# Patient Record
Sex: Female | Born: 1965 | Race: Black or African American | Hispanic: No | Marital: Married | Smoking: Never smoker
Health system: Southern US, Community
[De-identification: ages and names within clinical notes are randomized; demographics above are authoritative.]

## PROBLEM LIST (undated history)

## (undated) DIAGNOSIS — I10 Essential (primary) hypertension: Secondary | ICD-10-CM

## (undated) DIAGNOSIS — F419 Anxiety disorder, unspecified: Secondary | ICD-10-CM

## (undated) DIAGNOSIS — G4700 Insomnia, unspecified: Secondary | ICD-10-CM

## (undated) DIAGNOSIS — E119 Type 2 diabetes mellitus without complications: Secondary | ICD-10-CM

## (undated) HISTORY — DX: Essential (primary) hypertension: I10

## (undated) HISTORY — PX: CHOLECYSTECTOMY: SHX55

## (undated) HISTORY — DX: Insomnia, unspecified: G47.00

## (undated) HISTORY — PX: ABLATION: SHX5711

## (undated) HISTORY — DX: Anxiety disorder, unspecified: F41.9

---

## 2005-08-08 ENCOUNTER — Emergency Department (HOSPITAL_COMMUNITY): Admission: EM | Admit: 2005-08-08 | Discharge: 2005-08-08 | Payer: Self-pay | Admitting: Emergency Medicine

## 2007-03-08 ENCOUNTER — Emergency Department (HOSPITAL_COMMUNITY): Admission: EM | Admit: 2007-03-08 | Discharge: 2007-03-08 | Payer: Self-pay | Admitting: Emergency Medicine

## 2008-10-31 ENCOUNTER — Ambulatory Visit (HOSPITAL_COMMUNITY): Admission: RE | Admit: 2008-10-31 | Discharge: 2008-10-31 | Payer: Self-pay | Admitting: Family Medicine

## 2009-02-25 ENCOUNTER — Ambulatory Visit (HOSPITAL_COMMUNITY): Admission: RE | Admit: 2009-02-25 | Discharge: 2009-02-25 | Payer: Self-pay | Admitting: Family Medicine

## 2009-08-29 ENCOUNTER — Ambulatory Visit (HOSPITAL_COMMUNITY): Payer: Self-pay | Admitting: Psychiatry

## 2009-09-11 ENCOUNTER — Ambulatory Visit (HOSPITAL_COMMUNITY): Payer: Self-pay | Admitting: Psychiatry

## 2009-09-18 ENCOUNTER — Ambulatory Visit (HOSPITAL_COMMUNITY): Payer: Self-pay | Admitting: Psychiatry

## 2009-09-25 ENCOUNTER — Ambulatory Visit (HOSPITAL_COMMUNITY): Payer: Self-pay | Admitting: Psychiatry

## 2009-10-02 ENCOUNTER — Ambulatory Visit (HOSPITAL_COMMUNITY): Payer: Self-pay | Admitting: Psychiatry

## 2009-10-10 ENCOUNTER — Ambulatory Visit (HOSPITAL_COMMUNITY): Payer: Self-pay | Admitting: Psychiatry

## 2009-10-16 ENCOUNTER — Ambulatory Visit (HOSPITAL_COMMUNITY): Payer: Self-pay | Admitting: Psychiatry

## 2009-11-06 ENCOUNTER — Ambulatory Visit (HOSPITAL_COMMUNITY): Payer: Self-pay | Admitting: Psychiatry

## 2009-11-14 ENCOUNTER — Ambulatory Visit (HOSPITAL_COMMUNITY): Payer: Self-pay | Admitting: Psychiatry

## 2009-11-15 ENCOUNTER — Ambulatory Visit (HOSPITAL_COMMUNITY): Admission: RE | Admit: 2009-11-15 | Discharge: 2009-11-15 | Payer: Self-pay | Admitting: Internal Medicine

## 2009-11-21 ENCOUNTER — Ambulatory Visit (HOSPITAL_COMMUNITY): Admission: RE | Admit: 2009-11-21 | Discharge: 2009-11-21 | Payer: Self-pay | Admitting: Family Medicine

## 2010-08-10 ENCOUNTER — Encounter: Payer: Self-pay | Admitting: Family Medicine

## 2010-08-10 ENCOUNTER — Encounter: Payer: Self-pay | Admitting: Preventative Medicine

## 2010-09-30 ENCOUNTER — Other Ambulatory Visit (HOSPITAL_COMMUNITY): Payer: Self-pay | Admitting: Internal Medicine

## 2010-09-30 ENCOUNTER — Ambulatory Visit (HOSPITAL_COMMUNITY)
Admission: RE | Admit: 2010-09-30 | Discharge: 2010-09-30 | Disposition: A | Discharge status: Home / Self Care | Payer: 59 | Source: Ambulatory Visit | Attending: Internal Medicine | Admitting: Internal Medicine

## 2010-09-30 DIAGNOSIS — R1031 Right lower quadrant pain: Secondary | ICD-10-CM | POA: Insufficient documentation

## 2010-10-01 ENCOUNTER — Other Ambulatory Visit (HOSPITAL_COMMUNITY): Payer: Self-pay | Admitting: Internal Medicine

## 2010-10-01 DIAGNOSIS — R1031 Right lower quadrant pain: Secondary | ICD-10-CM

## 2010-10-03 ENCOUNTER — Other Ambulatory Visit (HOSPITAL_COMMUNITY): Payer: 59

## 2010-10-10 ENCOUNTER — Ambulatory Visit (HOSPITAL_COMMUNITY)
Admission: RE | Admit: 2010-10-10 | Discharge: 2010-10-10 | Disposition: A | Discharge status: Home / Self Care | Payer: 59 | Source: Ambulatory Visit | Attending: Internal Medicine | Admitting: Internal Medicine

## 2010-10-10 ENCOUNTER — Other Ambulatory Visit (HOSPITAL_COMMUNITY): Payer: 59

## 2010-10-10 ENCOUNTER — Other Ambulatory Visit (HOSPITAL_COMMUNITY): Payer: Self-pay | Admitting: Internal Medicine

## 2010-10-10 DIAGNOSIS — R1031 Right lower quadrant pain: Secondary | ICD-10-CM | POA: Insufficient documentation

## 2010-10-10 DIAGNOSIS — R932 Abnormal findings on diagnostic imaging of liver and biliary tract: Secondary | ICD-10-CM | POA: Insufficient documentation

## 2011-01-16 ENCOUNTER — Other Ambulatory Visit: Payer: Self-pay

## 2011-01-16 ENCOUNTER — Encounter (HOSPITAL_COMMUNITY)
Admission: RE | Admit: 2011-01-16 | Discharge: 2011-01-16 | Disposition: A | Discharge status: Home / Self Care | Payer: 59 | Source: Ambulatory Visit | Attending: Obstetrics & Gynecology | Admitting: Obstetrics & Gynecology

## 2011-01-16 LAB — COMPREHENSIVE METABOLIC PANEL
Alkaline Phosphatase: 67 U/L (ref 39–117)
BUN: 15 mg/dL (ref 6–23)
GFR calc Af Amer: >60 mL/min (ref >60)
Potassium: 3.8 mEq/L (ref 3.5–5.1)
Sodium: 137 mEq/L (ref 135–145)
Total Bilirubin: 0.2 mg/dL — ABNORMAL LOW (ref 0.3–1.2)
Total Protein: 7.6 g/dL (ref 6.0–8.3)

## 2011-01-16 LAB — CBC
Hemoglobin: 10.6 g/dL — ABNORMAL LOW (ref 12.0–15.0)
MCHC: 32.3 g/dL (ref 30.0–36.0)
MCV: 82.2 fL (ref 78.0–100.0)

## 2011-01-16 LAB — HCG, QUANTITATIVE, PREGNANCY: hCG, Beta Chain, Quant, S: 1 m[IU]/mL (ref <5)

## 2011-01-16 LAB — SURGICAL PCR SCREEN: MRSA, PCR: NEGATIVE

## 2011-01-23 ENCOUNTER — Ambulatory Visit (HOSPITAL_COMMUNITY)
Admission: RE | Admit: 2011-01-23 | Discharge: 2011-01-23 | Disposition: A | Discharge status: Home / Self Care | Payer: 59 | Source: Ambulatory Visit | Attending: Obstetrics & Gynecology | Admitting: Obstetrics & Gynecology

## 2011-01-23 ENCOUNTER — Other Ambulatory Visit: Payer: Self-pay | Admitting: Obstetrics & Gynecology

## 2011-01-23 ENCOUNTER — Ambulatory Visit (HOSPITAL_COMMUNITY): Payer: 59 | Admitting: Obstetrics & Gynecology

## 2011-01-23 DIAGNOSIS — Z01812 Encounter for preprocedural laboratory examination: Secondary | ICD-10-CM | POA: Insufficient documentation

## 2011-01-23 DIAGNOSIS — Z79899 Other long term (current) drug therapy: Secondary | ICD-10-CM | POA: Insufficient documentation

## 2011-01-23 DIAGNOSIS — I10 Essential (primary) hypertension: Secondary | ICD-10-CM | POA: Insufficient documentation

## 2011-01-23 DIAGNOSIS — E119 Type 2 diabetes mellitus without complications: Secondary | ICD-10-CM | POA: Insufficient documentation

## 2011-01-23 DIAGNOSIS — N920 Excessive and frequent menstruation with regular cycle: Secondary | ICD-10-CM | POA: Insufficient documentation

## 2011-01-23 DIAGNOSIS — N946 Dysmenorrhea, unspecified: Secondary | ICD-10-CM | POA: Insufficient documentation

## 2011-01-23 LAB — URINALYSIS, ROUTINE W REFLEX MICROSCOPIC
Protein, ur: NEGATIVE mg/dL
Specific Gravity, Urine: 1.03 (ref 1.005–1.030)
pH: 6 (ref 5.0–8.0)

## 2011-01-23 NOTE — Procedures (Addendum)
Preoperative diagnosis: Menometrorrhagia                                        Dysmenorrhea   Postoperative diagnoses: Same as above plus micro-endometrial polyps   Procedure: Hysteroscopy, uterine curettage, endometrial ablation  Surgeon: Despina Hidden MD  Anesthesia: Laryngeal mask airway  Findings: The endometrium was normal with the exception of very small endometrial polyps which I would characterize as micro. There were no fibroid or other abnormalities.  Description of operation: The patient was taken to the operating room and placed in the supine position. She underwent general anesthesia using the laryngeal mask airway. She was placed in the dorsal lithotomy position and prepped and draped in the usual sterile fashion. A Graves speculum was placed and the anterior cervical lip was grasped with a single-tooth tenaculum. The cervix was dilated serially to allow passage of the hysteroscope. Diagnostic hysteroscopy was performed and was found to be normal except for several very small micro-endometrial polyps. A vigorous uterine curettage was then performed and all tissue sent to pathology for evaluation. The ThermaChoice 3 endometrial ablation balloon was then used were 13 cc of D5W was required to maintain a pressure of 190-200 mm of mercury throughout the procedure.  Total therapy time was 9 min 31 sec.  All of the equipment worked well throughout the procedure. All of the fluid was returned at the end of the procedure. The patient was awakened from anesthesia and taken to the recovery room in good stable condition all counts were correct. She received 1 g of Ancef and 30 mg of Toradol preoperatively. She will be discharged from the recovery room and followed up in the office next week.

## 2011-01-24 ENCOUNTER — Other Ambulatory Visit: Payer: Self-pay | Admitting: Obstetrics & Gynecology

## 2011-01-25 ENCOUNTER — Other Ambulatory Visit: Payer: Self-pay | Admitting: Obstetrics & Gynecology

## 2011-03-09 ENCOUNTER — Other Ambulatory Visit: Payer: Self-pay | Admitting: Obstetrics & Gynecology

## 2011-03-09 DIAGNOSIS — Z139 Encounter for screening, unspecified: Secondary | ICD-10-CM

## 2011-03-13 ENCOUNTER — Ambulatory Visit (HOSPITAL_COMMUNITY): Payer: 59

## 2011-03-13 ENCOUNTER — Ambulatory Visit (HOSPITAL_COMMUNITY)
Admission: RE | Admit: 2011-03-13 | Discharge: 2011-03-13 | Disposition: A | Discharge status: Home / Self Care | Payer: 59 | Source: Ambulatory Visit | Attending: Obstetrics & Gynecology | Admitting: Obstetrics & Gynecology

## 2011-03-13 DIAGNOSIS — Z1231 Encounter for screening mammogram for malignant neoplasm of breast: Secondary | ICD-10-CM | POA: Insufficient documentation

## 2011-03-13 DIAGNOSIS — Z139 Encounter for screening, unspecified: Secondary | ICD-10-CM

## 2011-03-19 ENCOUNTER — Other Ambulatory Visit (HOSPITAL_COMMUNITY): Payer: Self-pay | Admitting: Family Medicine

## 2011-03-19 ENCOUNTER — Other Ambulatory Visit: Payer: Self-pay | Admitting: Obstetrics & Gynecology

## 2011-03-19 DIAGNOSIS — IMO0002 Reserved for concepts with insufficient information to code with codable children: Secondary | ICD-10-CM

## 2011-03-19 DIAGNOSIS — R928 Other abnormal and inconclusive findings on diagnostic imaging of breast: Secondary | ICD-10-CM

## 2011-03-19 DIAGNOSIS — R109 Unspecified abdominal pain: Secondary | ICD-10-CM

## 2011-03-19 DIAGNOSIS — R10819 Abdominal tenderness, unspecified site: Secondary | ICD-10-CM

## 2011-03-24 ENCOUNTER — Other Ambulatory Visit (HOSPITAL_COMMUNITY): Payer: 59

## 2011-03-25 ENCOUNTER — Ambulatory Visit (HOSPITAL_COMMUNITY)
Admission: RE | Admit: 2011-03-25 | Discharge: 2011-03-25 | Disposition: A | Discharge status: Home / Self Care | Payer: 59 | Source: Ambulatory Visit | Attending: Family Medicine | Admitting: Family Medicine

## 2011-03-25 DIAGNOSIS — R1031 Right lower quadrant pain: Secondary | ICD-10-CM | POA: Insufficient documentation

## 2011-03-25 DIAGNOSIS — R10819 Abdominal tenderness, unspecified site: Secondary | ICD-10-CM

## 2011-03-25 DIAGNOSIS — K7689 Other specified diseases of liver: Secondary | ICD-10-CM | POA: Insufficient documentation

## 2011-03-25 DIAGNOSIS — IMO0002 Reserved for concepts with insufficient information to code with codable children: Secondary | ICD-10-CM

## 2011-03-25 DIAGNOSIS — R109 Unspecified abdominal pain: Secondary | ICD-10-CM

## 2011-04-08 ENCOUNTER — Ambulatory Visit (HOSPITAL_COMMUNITY)
Admission: RE | Admit: 2011-04-08 | Discharge: 2011-04-08 | Disposition: A | Discharge status: Home / Self Care | Payer: 59 | Source: Ambulatory Visit | Attending: Obstetrics & Gynecology | Admitting: Obstetrics & Gynecology

## 2011-04-08 ENCOUNTER — Other Ambulatory Visit: Payer: Self-pay | Admitting: Obstetrics & Gynecology

## 2011-04-08 ENCOUNTER — Other Ambulatory Visit (HOSPITAL_COMMUNITY): Payer: Self-pay | Admitting: Obstetrics & Gynecology

## 2011-04-08 DIAGNOSIS — R928 Other abnormal and inconclusive findings on diagnostic imaging of breast: Secondary | ICD-10-CM

## 2011-10-21 ENCOUNTER — Other Ambulatory Visit (HOSPITAL_COMMUNITY): Payer: Self-pay | Admitting: Family Medicine

## 2011-10-21 DIAGNOSIS — Z09 Encounter for follow-up examination after completed treatment for conditions other than malignant neoplasm: Secondary | ICD-10-CM

## 2011-10-22 ENCOUNTER — Other Ambulatory Visit (HOSPITAL_COMMUNITY): Payer: Self-pay | Admitting: Family Medicine

## 2011-10-22 DIAGNOSIS — Z09 Encounter for follow-up examination after completed treatment for conditions other than malignant neoplasm: Secondary | ICD-10-CM

## 2011-10-28 ENCOUNTER — Ambulatory Visit (HOSPITAL_COMMUNITY): Payer: 59

## 2011-10-28 ENCOUNTER — Ambulatory Visit (HOSPITAL_COMMUNITY)
Admission: RE | Admit: 2011-10-28 | Discharge: 2011-10-28 | Disposition: A | Discharge status: Home / Self Care | Payer: 59 | Source: Ambulatory Visit | Attending: Family Medicine | Admitting: Family Medicine

## 2011-10-28 DIAGNOSIS — R928 Other abnormal and inconclusive findings on diagnostic imaging of breast: Secondary | ICD-10-CM | POA: Insufficient documentation

## 2011-10-28 DIAGNOSIS — Z09 Encounter for follow-up examination after completed treatment for conditions other than malignant neoplasm: Secondary | ICD-10-CM

## 2012-02-02 ENCOUNTER — Other Ambulatory Visit (HOSPITAL_COMMUNITY)
Admission: RE | Admit: 2012-02-02 | Discharge: 2012-02-02 | Disposition: A | Discharge status: Home / Self Care | Payer: 59 | Source: Ambulatory Visit | Attending: Obstetrics & Gynecology | Admitting: Obstetrics & Gynecology

## 2012-02-02 ENCOUNTER — Other Ambulatory Visit: Payer: Self-pay | Admitting: Obstetrics & Gynecology

## 2012-02-02 DIAGNOSIS — Z01419 Encounter for gynecological examination (general) (routine) without abnormal findings: Secondary | ICD-10-CM | POA: Insufficient documentation

## 2012-05-20 IMAGING — CT CT ABD-PELV W/O CM
2 of 4 series · 17 of 46 positions shown, 19 images · non-contrast
Comparison: CT abdomen pelvis 11/15/2009.

CLINICAL DATA: Right lower quadrant pain for 2 years.

CT ABDOMEN AND PELVIS WITHOUT CONTRAST
TECHNIQUE: Multidetector CT imaging of the abdomen and pelvis was
performed following the standard protocol without intravenous
contrast.

[Series 2: abdomen/pelvis w/o contrast · axial · non-contrast · 0.79mm/px · z∈[-467,-52]mm · 14 of 97 slices shown, 16 images]
[im 7/97  soft-tissue]
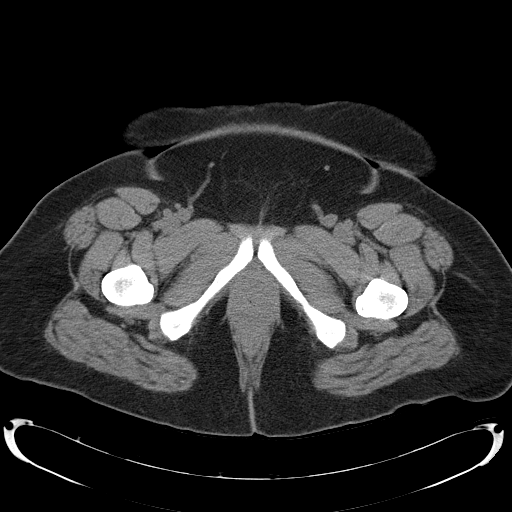
[im 7/97  bone]
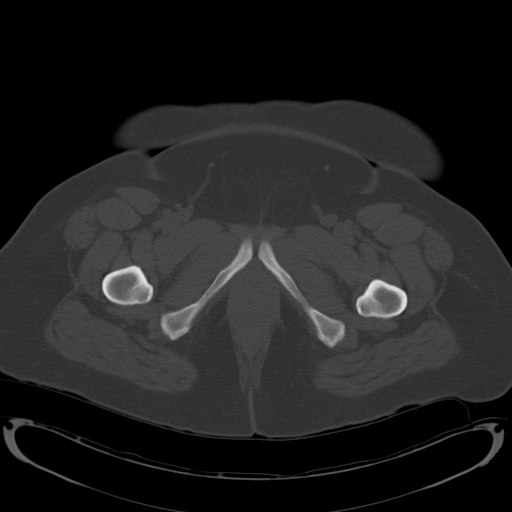
[im 13/97  soft-tissue]
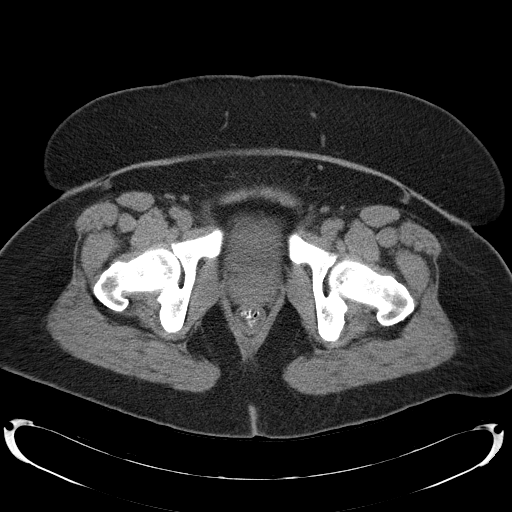
[im 20/97  soft-tissue]
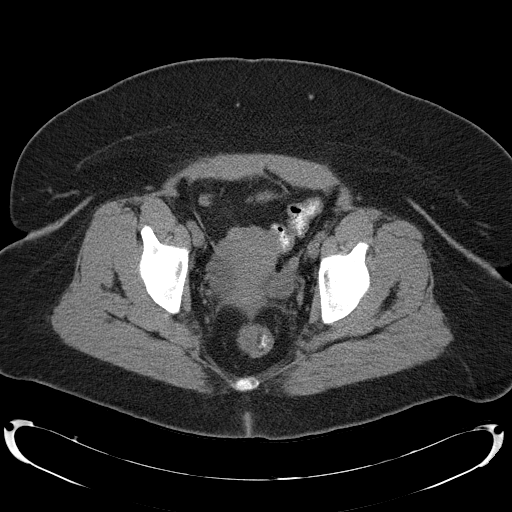
[im 26/97  soft-tissue]
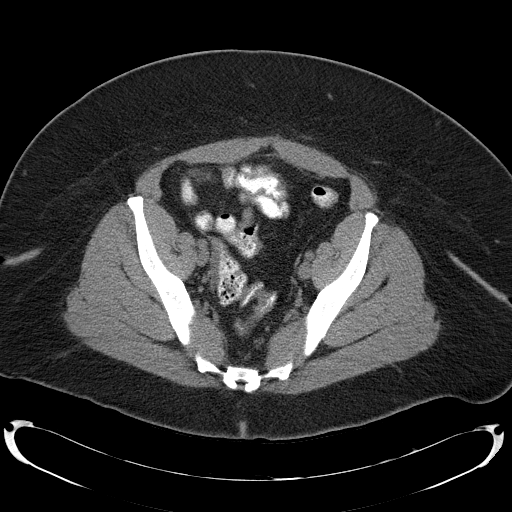
[im 33/97  soft-tissue]
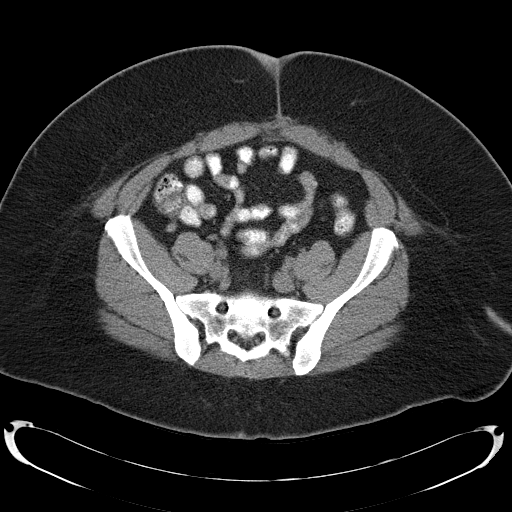
[im 39/97  soft-tissue]
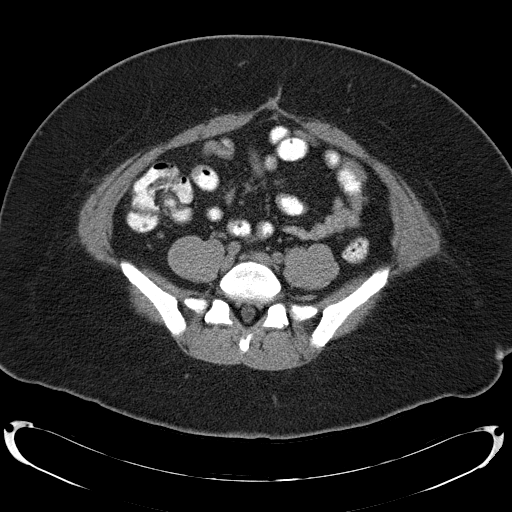
[im 45/97  soft-tissue]
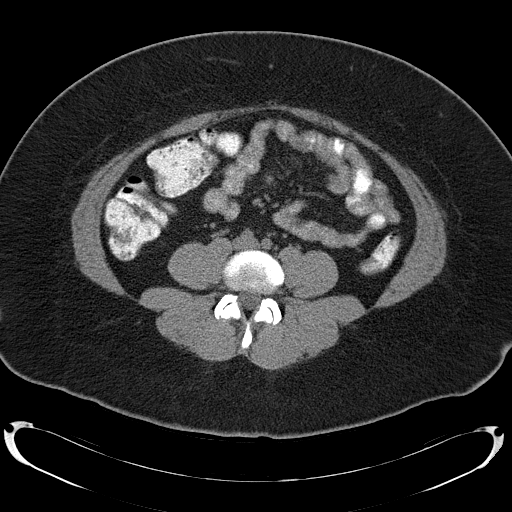
[im 52/97  soft-tissue]
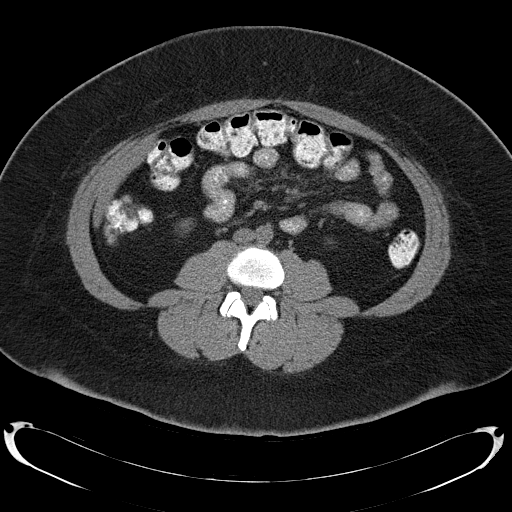
[im 58/97  soft-tissue]
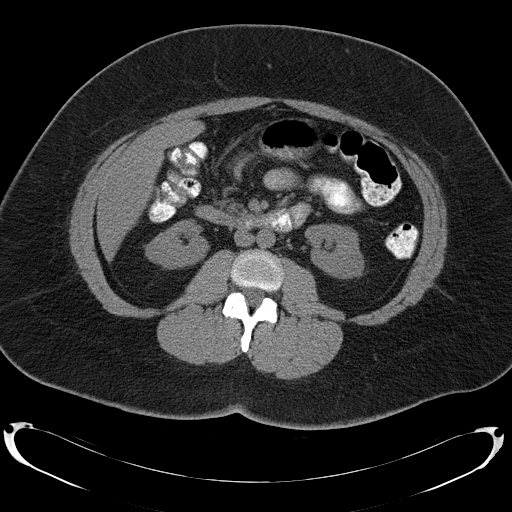
[im 58/97  bone]
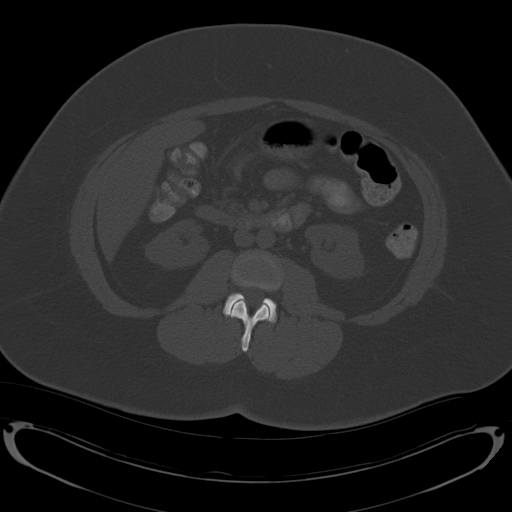
[im 65/97  soft-tissue]
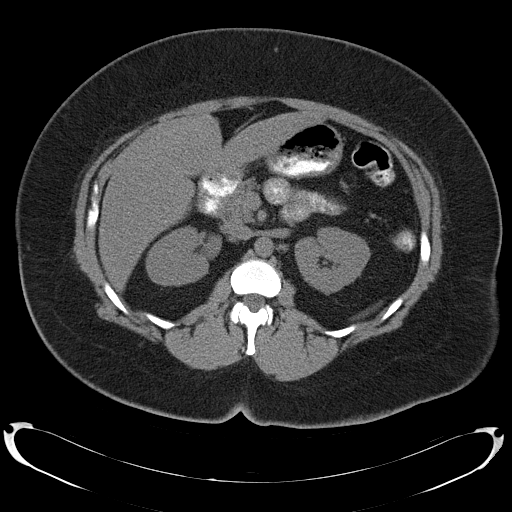
[im 71/97  soft-tissue]
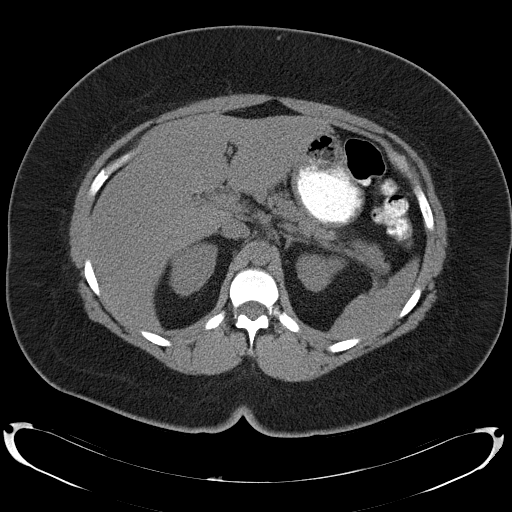
[im 77/97  soft-tissue]
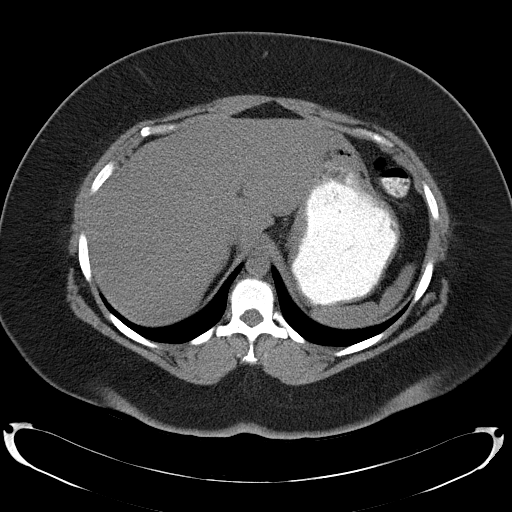
[im 84/97  soft-tissue]
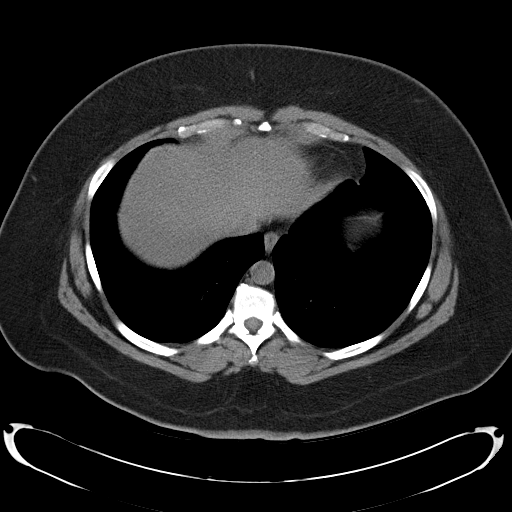
[im 90/97  soft-tissue]
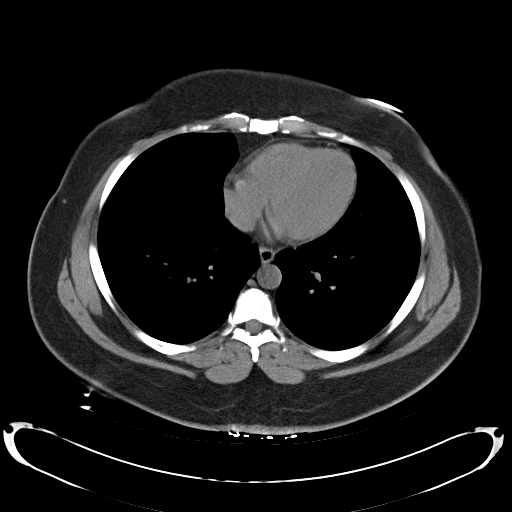

[Series 4: mpr cor (id) · coronal · 0.76mm/px · 3 of 84 slices shown]
[im 28/84  soft-tissue]
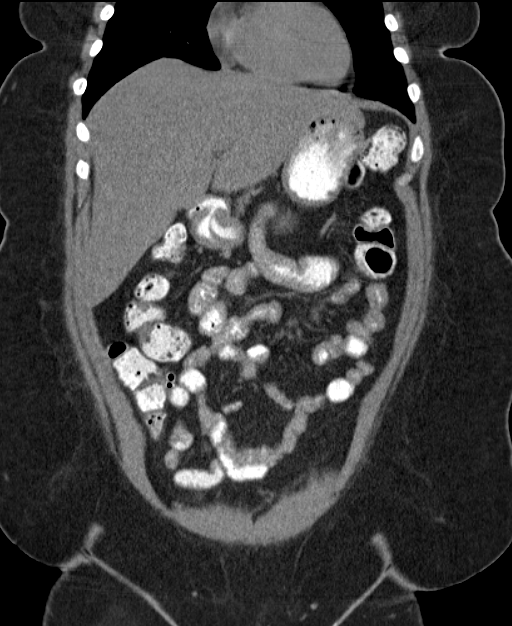
[im 37/84  soft-tissue]
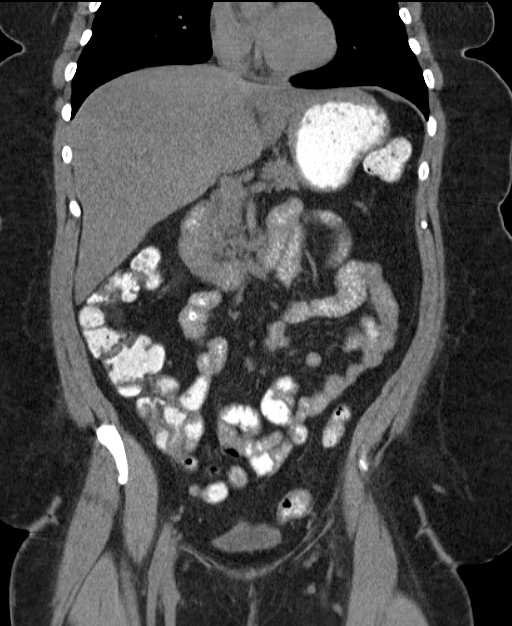
[im 47/84  soft-tissue]
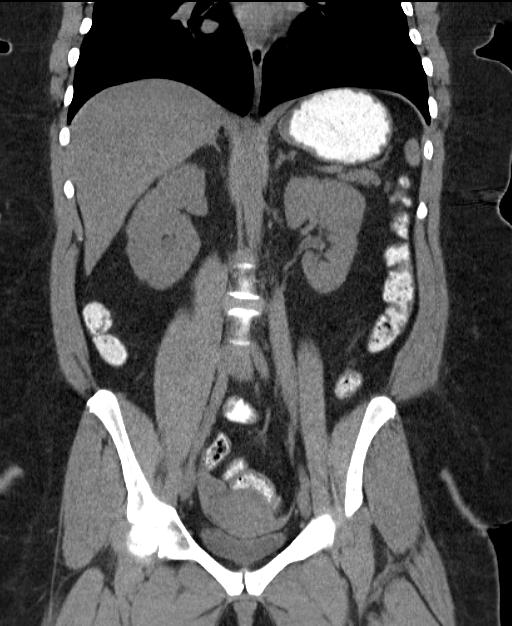

[17 of 46 positions shown; findings below may reference images not displayed]

FINDINGS: The lung bases are clear.  There is no pleural or
pericardial effusion.

The patient is status post cholecystectomy.  The liver is diffusely
low attenuating consistent with fatty change.  No focal liver
lesion is identified.  No renal or ureteral stones are seen.  The
patient has a duplicated right renal collecting system.  Urinary
bladder, uterus and adnexa appear normal.  The spleen, adrenal
glands and pancreas are unremarkable.  No lymphadenopathy or fluid.
The stomach, small and large bowel and appendix are unremarkable.
There is no focal bony abnormality.
IMPRESSION: 1.  No acute finding.  Negative for urinary tract stone.
2.  Fatty infiltration of the liver.
3.  Duplicated right renal collecting system is incidentally noted.
4.  Status post cholecystectomy.

## 2013-04-25 ENCOUNTER — Ambulatory Visit: Payer: Self-pay | Admitting: Obstetrics & Gynecology

## 2013-04-27 ENCOUNTER — Ambulatory Visit: Payer: Self-pay | Admitting: Obstetrics & Gynecology

## 2014-03-13 ENCOUNTER — Emergency Department (HOSPITAL_COMMUNITY)
Admission: EM | Admit: 2014-03-13 | Discharge: 2014-03-14 | Disposition: A | Discharge status: Home / Self Care | Payer: 59 | Attending: Emergency Medicine | Admitting: Emergency Medicine

## 2014-03-13 ENCOUNTER — Emergency Department (HOSPITAL_COMMUNITY): Payer: 59

## 2014-03-13 ENCOUNTER — Encounter (HOSPITAL_COMMUNITY): Payer: Self-pay | Admitting: Emergency Medicine

## 2014-03-13 DIAGNOSIS — Z79899 Other long term (current) drug therapy: Secondary | ICD-10-CM | POA: Diagnosis not present

## 2014-03-13 DIAGNOSIS — R079 Chest pain, unspecified: Secondary | ICD-10-CM | POA: Insufficient documentation

## 2014-03-13 DIAGNOSIS — E119 Type 2 diabetes mellitus without complications: Secondary | ICD-10-CM | POA: Diagnosis not present

## 2014-03-13 DIAGNOSIS — R0789 Other chest pain: Secondary | ICD-10-CM | POA: Diagnosis not present

## 2014-03-13 DIAGNOSIS — Z791 Long term (current) use of non-steroidal anti-inflammatories (NSAID): Secondary | ICD-10-CM | POA: Diagnosis not present

## 2014-03-13 HISTORY — DX: Type 2 diabetes mellitus without complications: E11.9

## 2014-03-13 LAB — CBC WITH DIFFERENTIAL/PLATELET
BASOS ABS: 0 10*3/uL (ref 0.0–0.1)
Basophils Relative: 0 % (ref 0–1)
Eosinophils Absolute: 0.1 10*3/uL (ref 0.0–0.7)
Eosinophils Relative: 1 % (ref 0–5)
HEMATOCRIT: 35.8 % — AB (ref 36.0–46.0)
Hemoglobin: 11.6 g/dL — ABNORMAL LOW (ref 12.0–15.0)
LYMPHS ABS: 2.3 10*3/uL (ref 0.7–4.0)
LYMPHS PCT: 25 % (ref 12–46)
MCH: 26.9 pg (ref 26.0–34.0)
MCHC: 32.4 g/dL (ref 30.0–36.0)
MCV: 82.9 fL (ref 78.0–100.0)
MONO ABS: 0.6 10*3/uL (ref 0.1–1.0)
Monocytes Relative: 6 % (ref 3–12)
NEUTROS ABS: 6.1 10*3/uL (ref 1.7–7.7)
Neutrophils Relative %: 68 % (ref 43–77)
PLATELETS: 314 10*3/uL (ref 150–400)
RBC: 4.32 MIL/uL (ref 3.87–5.11)
RDW: 13.1 % (ref 11.5–15.5)
WBC: 9 10*3/uL (ref 4.0–10.5)

## 2014-03-13 LAB — BASIC METABOLIC PANEL
ANION GAP: 14 (ref 5–15)
BUN: 13 mg/dL (ref 6–23)
CHLORIDE: 94 meq/L — AB (ref 96–112)
CO2: 27 meq/L (ref 19–32)
Calcium: 9.3 mg/dL (ref 8.4–10.5)
Creatinine, Ser: 0.97 mg/dL (ref 0.50–1.10)
GFR calc Af Amer: 79 mL/min — ABNORMAL LOW (ref >90)
GFR calc non Af Amer: 68 mL/min — ABNORMAL LOW (ref >90)
Glucose, Bld: 267 mg/dL — ABNORMAL HIGH (ref 70–99)
Potassium: 3.9 mEq/L (ref 3.7–5.3)
SODIUM: 135 meq/L — AB (ref 137–147)

## 2014-03-13 LAB — TROPONIN I

## 2014-03-13 MED ORDER — NAPROXEN 500 MG PO TABS: 500.0000 mg | ORAL_TABLET | Freq: Two times a day (BID) | ORAL | Status: DC

## 2014-03-13 MED ORDER — NAPROXEN 250 MG PO TABS
500.0000 mg | ORAL_TABLET | Freq: Once | ORAL | Status: AC
  Administered 2014-03-13: 500 mg via ORAL
  Filled 2014-03-13: qty 2

## 2014-03-13 NOTE — ED Notes (Signed)
Pt c/o right sided CP for 2 weeks, states pain has gotten worse, denies N/V or SOB

## 2014-03-13 NOTE — ED Provider Notes (Signed)
CSN: [id number]     Arrival date & time 03/13/14  2119 History  This chart was scribed for Theresa Roller, MD by Milly Jakob, ED Scribe. The patient was seen in room APA08/APA08. Patient's care was started at 11:10 PM.   Chief Complaint  Patient presents with  . Chest Pain   The history is provided by the patient. No language interpreter was used.   HPI Comments: Theresa Sullivan is a 48 y.o. female with a history of DM who presents to the Emergency Department complaining of intermittent, right sided, upper-substernal, chest pain which began two weeks ago. She states that the pain feels like a muscular strain. She reports that the pain is exacerbated by movement and position. She reports nausea, diarrhea, and difficulty eating. She reports a little bit of productive coughing. She also reports hearing changes in her right ear. She denies SOB. She states that the pain is not worsened by exertion or deep breathing. She reports that she was seen by her PCP for this last Friday with no diagnosis. She reports that she is borderline diabetic and she takes Metformin for this. She does not smoke. She denies a family history of heart disease. She denies chest injury or trauma, recent surgery or traveling. She states that she sits at work but gets up regularly. No risk factors for PE.   Past Medical History  Diagnosis Date  . Diabetes mellitus without complication    Past Surgical History  Procedure Laterality Date  . Cholecystectomy    . Ablation     History reviewed. No pertinent family history. History  Substance Use Topics  . Smoking status: Never Smoker   . Smokeless tobacco: Not on file  . Alcohol Use: No   OB History   Grav Para Term Preterm Abortions TAB SAB Ect Mult Living                 Review of Systems  Cardiovascular: Positive for chest pain.  All other systems reviewed and are negative.     Allergies  Review of patient's allergies indicates no known allergies.  Home  Medications   Prior to Admission medications   Medication Sig Start Date End Date Taking? Authorizing Provider  ferrous sulfate 325 (65 FE) MG tablet Take 325 mg by mouth daily.   Yes Historical Provider, MD  Boris Lown Oil 300 MG CAPS Take 900 mg by mouth daily.   Yes Historical Provider, MD  metFORMIN (GLUCOPHAGE) 500 MG tablet Take 500 mg by mouth daily.   Yes Historical Provider, MD  naproxen (NAPROSYN) 500 MG tablet Take 1 tablet (500 mg total) by mouth 2 (two) times daily with a meal. 03/13/14   Theresa Roller, MD   Triage Vitals: BP 159/85  Pulse 93  Temp(Src) 97.5 F (36.4 C) (Oral)  Resp 18  Ht  (1.626 m)  Wt 220 lb (99.791 kg)  BMI 37.74 kg/m2  SpO2 100% Physical Exam  Nursing note and vitals reviewed. Constitutional: She appears well-developed and well-nourished. No distress.  HENT:  Head: Normocephalic and atraumatic.  Mouth/Throat: Oropharynx is clear and moist. No oropharyngeal exudate.  Eyes: Conjunctivae and EOM are normal. Pupils are equal, round, and reactive to light. Right eye exhibits no discharge. Left eye exhibits no discharge. No scleral icterus.  Neck: Normal range of motion. Neck supple. No JVD present. No thyromegaly present.  Cardiovascular: Normal rate, regular rhythm, normal heart sounds and intact distal pulses.  Exam reveals no gallop and  no friction rub.   No murmur heard. Pulmonary/Chest: Effort normal and breath sounds normal. No respiratory distress. She has no wheezes. She has no rales. She exhibits tenderness (right, upper, parasturnal).  Abdominal: Soft. Bowel sounds are normal. She exhibits no distension and no mass. There is no tenderness.  Musculoskeletal: Normal range of motion. She exhibits no edema and no tenderness.  Lymphadenopathy:    She has no cervical adenopathy.  Neurological: She is alert. Coordination normal.  Skin: Skin is warm and dry. No rash noted. No erythema.  Psychiatric: She has a normal mood and affect. Her behavior is  normal.    ED Course  Procedures (including critical care time)  11:19 PM-Discussed treatment plan which includes CXR with pt at bedside and pt agreed to plan.   Labs Review Labs Reviewed  CBC WITH DIFFERENTIAL - Abnormal; Notable for the following:    Hemoglobin 11.6 (*)    HCT 35.8 (*)    All other components within normal limits  BASIC METABOLIC PANEL - Abnormal; Notable for the following:    Sodium 135 (*)    Chloride 94 (*)    Glucose, Bld 267 (*)    GFR calc non Af Amer 68 (*)    GFR calc Af Amer 79 (*)    All other components within normal limits  TROPONIN I    Imaging Review Dg Chest 2 View  03/13/2014   CLINICAL DATA:  Chest pain right-sided for 3 months with weakness and shortness of breath.  EXAM: CHEST  2 VIEW  COMPARISON:  None.  FINDINGS: Lungs are adequately inflated without consolidation or effusion. Cardiomediastinal silhouette is within normal. Bones and soft tissues are within normal.  IMPRESSION: No active cardiopulmonary disease.   Electronically Signed   By: Elberta Fortis M.D.   On: 03/13/2014 21:57     EKG Interpretation   Date/Time:  Tuesday March 13 2014 22:10:47 EDT Ventricular Rate:  78 PR Interval:  167 QRS Duration: 84 QT Interval:  394 QTC Calculation: 449 R Axis:   40 Text Interpretation:  Sinus rhythm Nonspecific T wave abnormality  Borderline ECG since last tracing no significant change Confirmed by  Hyacinth Meeker  MD, Alexee Delsanto ([id number]) on 03/13/2014 11:00:23 PM      MDM   Final diagnoses:  Other chest pain    At this time the patient is having only minimal symptoms which are worse with palpation of the chest wall, she endorses having increased pain with change of position and movement of the right arm, with her recent upper respiratory symptoms this is likely related to costochondritis, she does not have exertional pain, has no objective findings of cardiac ischemia on EKG, chest x-ray or laboratory workup. I recommended that she obtain  cardiology consultation should she have worsening symptoms or persistent symptoms and that she followup this week with her family doctor. She is in agreement. Vital signs unremarkable other than mild hypertension, patient informed and will followup.  Meds given in ED:  Medications  naproxen (NAPROSYN) tablet 500 mg (not administered)    New Prescriptions   NAPROXEN (NAPROSYN) 500 MG TABLET    Take 1 tablet (500 mg total) by mouth 2 (two) times daily with a meal.    I personally performed the services described in this documentation, which was scribed in my presence. The recorded information has been reviewed and is accurate.      Theresa Roller, MD 03/13/14 [phone]

## 2014-03-13 NOTE — Discharge Instructions (Signed)
Please call your doctor for a followup appointment within 24-48 hours. When you talk to your doctor please let them know that you were seen in the emergency department and have them acquire all of your records so that they can discuss the findings with you and formulate a treatment plan to fully care for your new and ongoing problems. ° °

## 2014-03-14 DIAGNOSIS — R0789 Other chest pain: Secondary | ICD-10-CM | POA: Diagnosis not present

## 2014-03-14 NOTE — ED Notes (Signed)
Pt alert & oriented x4, stable gait. Patient given discharge instructions, paperwork & prescription(s). Patient  instructed to stop at the registration desk to finish any additional paperwork. Patient verbalized understanding. Pt left department w/ no further questions. 

## 2015-02-20 ENCOUNTER — Encounter (HOSPITAL_COMMUNITY): Payer: Self-pay | Admitting: *Deleted

## 2015-02-20 ENCOUNTER — Emergency Department (HOSPITAL_COMMUNITY)
Admission: EM | Admit: 2015-02-20 | Discharge: 2015-02-21 | Disposition: A | Discharge status: Home / Self Care | Payer: 59 | Attending: Emergency Medicine | Admitting: Emergency Medicine

## 2015-02-20 DIAGNOSIS — Z791 Long term (current) use of non-steroidal anti-inflammatories (NSAID): Secondary | ICD-10-CM | POA: Insufficient documentation

## 2015-02-20 DIAGNOSIS — T7809XA Anaphylactic reaction due to other food products, initial encounter: Secondary | ICD-10-CM | POA: Insufficient documentation

## 2015-02-20 DIAGNOSIS — Z79899 Other long term (current) drug therapy: Secondary | ICD-10-CM | POA: Insufficient documentation

## 2015-02-20 DIAGNOSIS — X58XXXA Exposure to other specified factors, initial encounter: Secondary | ICD-10-CM | POA: Insufficient documentation

## 2015-02-20 DIAGNOSIS — Y998 Other external cause status: Secondary | ICD-10-CM | POA: Diagnosis not present

## 2015-02-20 DIAGNOSIS — E119 Type 2 diabetes mellitus without complications: Secondary | ICD-10-CM | POA: Diagnosis not present

## 2015-02-20 DIAGNOSIS — Y9289 Other specified places as the place of occurrence of the external cause: Secondary | ICD-10-CM | POA: Diagnosis not present

## 2015-02-20 DIAGNOSIS — Y9389 Activity, other specified: Secondary | ICD-10-CM | POA: Diagnosis not present

## 2015-02-20 DIAGNOSIS — R21 Rash and other nonspecific skin eruption: Secondary | ICD-10-CM | POA: Diagnosis present

## 2015-02-20 DIAGNOSIS — T782XXA Anaphylactic shock, unspecified, initial encounter: Secondary | ICD-10-CM

## 2015-02-20 MED ORDER — METHYLPREDNISOLONE SODIUM SUCC 125 MG IJ SOLR
INTRAMUSCULAR | Status: AC
  Administered 2015-02-20: 125 mg via INTRAVENOUS
  Filled 2015-02-20: qty 2

## 2015-02-20 MED ORDER — DIPHENHYDRAMINE HCL 50 MG/ML IJ SOLN
25.0000 mg | Freq: Once | INTRAMUSCULAR | Status: AC
  Administered 2015-02-20: 25 mg via INTRAVENOUS
  Filled 2015-02-20: qty 1

## 2015-02-20 MED ORDER — EPINEPHRINE 0.3 MG/0.3ML IJ SOAJ
INTRAMUSCULAR | Status: AC
Start: 2015-02-20 — End: 2015-02-20
  Administered 2015-02-20: 0.3 mg via NASAL
  Filled 2015-02-20: qty 0.3

## 2015-02-20 MED ORDER — DIPHENHYDRAMINE HCL 50 MG/ML IJ SOLN
INTRAMUSCULAR | Status: AC
  Administered 2015-02-20: 25 mg via INTRAVENOUS
  Filled 2015-02-20: qty 1

## 2015-02-20 MED ORDER — FAMOTIDINE IN NACL 20-0.9 MG/50ML-% IV SOLN
INTRAVENOUS | Status: AC
  Administered 2015-02-20: 20 mg via INTRAVENOUS
  Filled 2015-02-20: qty 50

## 2015-02-20 NOTE — ED Notes (Signed)
Pt c/o itching, hives, throat and mouth swelling that started less than a hour ago, denies any new exposures,

## 2015-02-20 NOTE — ED Provider Notes (Signed)
CSN: [id number]     Arrival date & time 02/20/15  2208 History  This chart was scribed for Glynn Octave, MD by Budd Palmer, ED Scribe. This patient was seen in room APA06/APA06 and the patient's care was started at 10:18 PM.    Chief Complaint  Patient presents with  . Allergic Reaction   The history is provided by the patient. No language interpreter was used.   HPI Comments: Level 5 Caveat Theresa Sullivan is a 49 y.o. female who presents to the Emergency Department complaining of an allergic reaction 30 minutes after eating shrimp with oyster sauce. She notes associated facial, throat and tongue swelling as well as generalized itchiness and a rash. She has taken 2 benadryl PTA with no relief. She has eaten shrimp before with no reaction, but never oyster sauce. She has a PMHx of DM. Pt is on metformin. Pt does not smoke. She denies a PMHx of COPD. Pt denies CP, abdominal pain, vomiting.  Past Medical History  Diagnosis Date  . Diabetes mellitus without complication    Past Surgical History  Procedure Laterality Date  . Cholecystectomy    . Ablation     No family history on file. History  Substance Use Topics  . Smoking status: Never Smoker   . Smokeless tobacco: Not on file  . Alcohol Use: No   OB History    No data available     Review of Systems  Unable to perform ROS: Acuity of condition   A complete 10 system review of systems was obtained and all systems are negative except as noted in the HPI and PMH.    Allergies  Review of patient's allergies indicates no known allergies.  Home Medications   Prior to Admission medications   Medication Sig Start Date End Date Taking? Authorizing Provider  diphenhydrAMINE (BENADRYL) 25 MG tablet Take 1 tablet (25 mg total) by mouth every 6 (six) hours. 02/21/15   Glynn Octave, MD  EPINEPHrine 0.3 mg/0.3 mL IJ SOAJ injection Inject 0.3 mLs (0.3 mg total) into the muscle once. For severe allergic reaction with difficulty  breathing or swallowing 02/21/15   Glynn Octave, MD  ferrous sulfate 325 (65 FE) MG tablet Take 325 mg by mouth daily.    Historical Provider, MD  Boris Lown Oil 300 MG CAPS Take 900 mg by mouth daily.    Historical Provider, MD  metFORMIN (GLUCOPHAGE) 500 MG tablet Take 500 mg by mouth daily.    Historical Provider, MD  naproxen (NAPROSYN) 500 MG tablet Take 1 tablet (500 mg total) by mouth 2 (two) times daily with a meal. 03/13/14   Eber Hong, MD  predniSONE (DELTASONE) 50 MG tablet 1 tablet PO daily 02/21/15   Glynn Octave, MD   BP 153/87 mmHg  Pulse 113  Temp(Src) 98.4 F (36.9 C) (Oral)  Resp 18  Ht 5\' 5"  (1.651 m)  Wt 200 lb (90.719 kg)  BMI 33.28 kg/m2  SpO2 97% Physical Exam  Constitutional: She is oriented to person, place, and time. She appears well-developed and well-nourished. No distress.  HENT:  Head: Normocephalic and atraumatic.  Mouth/Throat: Oropharynx is clear and moist. No oropharyngeal exudate.  Mild tongue swelling without lip swelling.  Eyes: Conjunctivae and EOM are normal. Pupils are equal, round, and reactive to light.  Neck: Normal range of motion. Neck supple.  No meningismus.  Cardiovascular: Normal rate, normal heart sounds and intact distal pulses.   No murmur heard. tachycardic  Pulmonary/Chest: Effort normal and breath  sounds normal. No respiratory distress. She has no wheezes.  Lungs are clear  Abdominal: Soft. There is no tenderness. There is no rebound and no guarding.  Musculoskeletal: Normal range of motion. She exhibits no edema or tenderness.  Neurological: She is alert and oriented to person, place, and time. No cranial nerve deficit. She exhibits normal muscle tone. Coordination normal.  No ataxia on finger to nose bilaterally. No pronator drift. 5/5 strength throughout. CN 2-12 intact. Negative Romberg. Equal grip strength. Sensation intact. Gait is normal.   Skin: Skin is warm.  Diffuse urticaria to head, neck, and torso.   Psychiatric:  She has a normal mood and affect. Her behavior is normal.  Nursing note and vitals reviewed.   ED Course  Procedures  DIAGNOSTIC STUDIES: Oxygen Saturation is 98% on RA, normal by my interpretation.    COORDINATION OF CARE: 10:25 PM - Gave Epi-pen. Discussed plans to order medications. Pt advised of plan for treatment and pt agrees.  Labs Review Labs Reviewed - No data to display  Imaging Review No results found.   EKG Interpretation None      MDM   Final diagnoses:  Anaphylaxis, initial encounter   Patient with sudden onset of itching, difficulty breathing, difficulty swallowing, tongue swelling after eating shrimp with oyster sauce last one hour ago. Denies chest pain or shortness of breath. Level V caveat for acuity of condition. She is not on ACE inhibitor.  Patient given IM epinephrine, solu-Medrol, Benadryl, Pepcid, IV fluids.  Presentation consistent with anaphylaxis.  Recheck 11:45 PM. Patient is feeling improved. Her rash has abated. Her tongue swelling has stabilized. it is improving. She is not hypoxic and has no wheezing.  Discussed likely allergic reaction to shrimp and/or oysters. Advised to avoid all seafood. She will need monitoring until 1:30 AM to assess for recurrence.  Anticipate discharge home with steroids, antihistamines and epinephrine pen for emergency use.  CRITICAL CARE Performed by: Glynn Octave Total critical care time: 35 Critical care time was exclusive of separately billable procedures and treating other patients. Critical care was necessary to treat or prevent imminent or life-threatening deterioration. Critical care was time spent personally by me on the following activities: development of treatment plan with patient and/or surrogate as well as nursing, discussions with consultants, evaluation of patient's response to treatment, examination of patient, obtaining history from patient or surrogate, ordering and performing treatments  and interventions, ordering and review of laboratory studies, ordering and review of radiographic studies, pulse oximetry and re-evaluation of patient's condition.  I personally performed the services described in this documentation, which was scribed in my presence. The recorded information has been reviewed and is accurate.      Glynn Octave, MD 02/21/15 [phone]

## 2015-02-21 MED ORDER — EPINEPHRINE 0.3 MG/0.3ML IJ SOAJ: 0.3000 mg | Freq: Once | INTRAMUSCULAR | Status: DC

## 2015-02-21 MED ORDER — PREDNISONE 50 MG PO TABS: ORAL_TABLET | ORAL | Status: DC

## 2015-02-21 MED ORDER — DIPHENHYDRAMINE HCL 25 MG PO TABS: 25.0000 mg | ORAL_TABLET | Freq: Four times a day (QID) | ORAL | Status: DC

## 2015-02-21 NOTE — ED Notes (Signed)
Instructed pt to keep Epi pen and benadryl on person at all times.  Pt states understanding of care given, follow up care, and discharge instructions. Ambulated from emergency room on own accord

## 2015-02-21 NOTE — Discharge Instructions (Signed)

## 2015-07-23 ENCOUNTER — Ambulatory Visit (INDEPENDENT_AMBULATORY_CARE_PROVIDER_SITE_OTHER): Payer: 59 | Admitting: Allergy and Immunology

## 2015-07-23 VITALS — BP 138/82 | HR 84 | Temp 98.2°F | Resp 16 | Ht 64.96 in | Wt 225.0 lb

## 2015-07-23 DIAGNOSIS — R05 Cough: Secondary | ICD-10-CM

## 2015-07-23 DIAGNOSIS — H1010 Acute atopic conjunctivitis, unspecified eye: Secondary | ICD-10-CM

## 2015-07-23 DIAGNOSIS — J309 Allergic rhinitis, unspecified: Secondary | ICD-10-CM

## 2015-07-23 DIAGNOSIS — Z91013 Allergy to seafood: Secondary | ICD-10-CM

## 2015-07-23 DIAGNOSIS — R059 Cough, unspecified: Secondary | ICD-10-CM

## 2015-07-23 MED ORDER — TRIAMCINOLONE ACETONIDE 55 MCG/ACT NA AERO: 2.0000 | INHALATION_SPRAY | Freq: Every day | NASAL | Status: DC

## 2015-07-23 MED ORDER — FEXOFENADINE HCL 180 MG PO TABS: 180.0000 mg | ORAL_TABLET | Freq: Every day | ORAL | Status: DC

## 2015-07-23 MED ORDER — ALBUTEROL SULFATE HFA 108 (90 BASE) MCG/ACT IN AERS: 2.0000 | INHALATION_SPRAY | RESPIRATORY_TRACT | Status: DC | PRN

## 2015-07-23 NOTE — Patient Instructions (Addendum)
Take Home Sheet  1. Avoidance: Mite, Mold and Pollen an shellfish.   2. Antihistamine: Allegra 180mg  by mouth once daily for runny nose or itching.   3. Nasal Spray: Nasacort AQ 1-2 spray(s) each nostril once daily for stuffy nose or drainage.   4. ProAir 2 puffs every 6 hours as needed for cough or wheeze.     Keep diary of ProAir use and call with any recurring use.  5. Other: Epi-pen/Benadryl as needed.   6. Nasal Saline wash once daily during shower time as directed.   7. Follow up Visit:  2 months or sooner if needed.   Websites that have reliable Patient information: 1. American Academy of Asthma, Allergy, & Immunology: www.aaaai.org 2. [employer]: www.foodallergy.org 3. [employer]s of Asthmatics: www.aanma.org 4. [facility]: https://www.strong.com/www.njc.org 5. American College of Allergy, Asthma, & Immunology: [employer].iswww.allergy.[employer].edu or www.acaai.org

## 2015-07-23 NOTE — Progress Notes (Signed)
NEW PATIENT NOTE  RE: Theresa Sullivan MRN: [id number] DOB: 1965/09/26 [facility] [address] [place], [place] [address] Date of Office Visit: 07/23/2015  Referring provider: Avis Epley, PA-C [address] [place], [place] [address]  Subjective:  Theresa Sullivan is a 50 y.o. female who presents today for Allergy Testing.  Assessment:   1. Cough, likely secondary to nasal symptoms--clear lung exam and excellent in office spirometry.   2. Shellfish allergy.   3. Allergic rhinoconjunctivitis.   4.      Complex medical history.   Plan:   Meds ordered this encounter  Medications  . albuterol (PROAIR HFA) 108 (90 Base) MCG/ACT inhaler    Sig: Inhale 2 puffs into the lungs every 4 (four) hours as needed for wheezing or shortness of breath.    Dispense:  1 Inhaler    Refill:  1  . triamcinolone (NASACORT AQ) 55 MCG/ACT AERO nasal inhaler    Sig: Place 2 sprays into the nose daily.    Dispense:  1 Bottle    Refill:  5  . fexofenadine (ALLEGRA) 180 MG tablet    Sig: Take 1 tablet (180 mg total) by mouth daily.    Dispense:  30 tablet    Refill:  5   Patient Instructions  1. Avoidance: Mite, Mold and Pollen and shellfish. 2. Antihistamine: Allegra 180mg  by mouth once daily for runny nose or itching. 3. Nasal Spray: Nasacort AQ 1-2 spray(s) each nostril once daily for stuffy nose or drainage. 4. ProAir 2 puffs every 6 hours as needed for cough or wheeze.     Keep diary of ProAir use and call with any recurring use. 5. Other: Epi-pen/Benadryl as needed. 6. Nasal Saline wash once daily during shower time as directed. 7. Follow up Visit:  2 months or sooner if needed.  HPI: Theresa Sullivan presents to the office with at least a one-year history of recurring rhinorrhea, congestion, ear pressure, itchy watery eyes and cough.  Typically not associated with wheeze, difficulty breathing, shortness of breath, chest infection or disrupted sleep and  activity.  She describes outdoors, strong odors, perfumes and cigarette smoke as provoking her symptoms, but now she wonders about allergic triggers, especially with skin concerns recently.  She notices itching episodes prompting Benadryl use every few months and episode in 2016 of throat irritation with hives.  On 2 separate occasions after shrimp or oyster stirfry.   She did have an ED visit, but no hospitalizations or systemic steroids.  Denies exercise induced difficulty or bronchodilator use.    Medical History: Past Medical History  Diagnosis Date  . Diabetes mellitus without complication (HCC)    Hypertension/Hypercholersterolemia.  Surgical History: Past Surgical History  Procedure Laterality Date  . Cholecystectomy    . Ablation     Family History: Family History  Problem Relation Age of Onset  . Asthma Mother   . Allergic rhinitis Neg Hx   . Angioedema Neg Hx   . Eczema Neg Hx   . Immunodeficiency Neg Hx   . Urticaria Neg Hx    Social History: Social History  . Marital Status: Married    Spouse Name: N/A  . Number of Children: N/A  . Years of Education: N/A   Social History Main Topics  . Smoking status: Never Smoker   . Smokeless tobacco: Not on file  . Alcohol Use: No  . Drug Use: No  . 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  Medications prior to this encounter: Outpatient Prescriptions Prior to Visit  Medication Sig Dispense Refill  . diphenhydrAMINE (BENADRYL) 25 MG tablet Take 1 tablet (25 mg total) by mouth every 6 (six) hours. 20 tablet 0  . EPINEPHrine 0.3 mg/0.3 mL IJ SOAJ injection Inject 0.3 mLs (0.3 mg total) into the muscle once. For severe allergic reaction with difficulty breathing or swallowing 1 Device 1  . ferrous sulfate 325 (65 FE) MG tablet Take 325 mg by mouth daily.    . naproxen (NAPROSYN) 500 MG tablet Take 1  tablet (500 mg total) by mouth 2 (two) times daily with a meal. 30 tablet 0  . Krill Oil 300 MG CAPS Take 900 mg by mouth daily. Reported on 07/23/2015    . predniSONE (DELTASONE) 50 MG tablet 1 tablet PO daily (Patient not taking: Reported on 07/23/2015) 5 tablet 0  . metFORMIN (GLUCOPHAGE) 500 MG tablet Take 500 mg by mouth daily.     No facility-administered medications prior to visit.   Drug Allergies: No Known Allergies  Environmental History: Theresa Sullivan lives in a 50 year old house since built, carpeted throughout with central air and heat, Stuffed mattress non-feather pillow and comforter without humidifier, pets or smokers.  Review of Systems  Constitutional: Negative for fever, weight loss and malaise/fatigue.       Childhood varicella disease.  HENT: Positive for congestion. Negative for ear pain, hearing loss, nosebleeds and sore throat.   Eyes: Negative for discharge and redness.  Respiratory: Negative for shortness of breath.        Denies history of recent bronchitis (last 20 years ago) and pneumonia.  Gastrointestinal: Positive for heartburn (Associated with tomato-based products). Negative for nausea, vomiting, abdominal pain, diarrhea and constipation.  Genitourinary: Negative.   Musculoskeletal: Negative for myalgias and joint pain.  Skin: Positive for itching (rare without associated rash or hives , except as indicated in history of present illness.Marland Kitchen.). Negative for rash.  Neurological: Positive for headaches (sinus pressure). Negative for dizziness, seizures and weakness.  Endo/Heme/Allergies: Positive for environmental allergies.       Denies sensitivity to aspirin, NSAIDs, stinging insects, foods, latex and cosmetics.  Avoids costume jewelry due to itching.    Objective:   Filed Vitals:   07/23/15 0909  BP: 138/82  Pulse: 84  Temp: 98.2 F (36.8 C)  Resp: 16   SpO2 Readings from Last 1 Encounters:  07/23/15 98%   Physical Exam  Constitutional: She is  well-developed, well-nourished, and in no distress.  HENT:  Head: Atraumatic.  Right Ear: Tympanic membrane and ear canal normal.  Left Ear: Tympanic membrane and ear canal normal.  Nose: Mucosal edema present. No rhinorrhea. No epistaxis.  Mouth/Throat: Oropharynx is clear and moist and mucous membranes are normal. No oropharyngeal exudate, posterior oropharyngeal edema or posterior oropharyngeal erythema.  Eyes: Conjunctivae are normal.  Neck: Neck supple.  Cardiovascular: Normal rate, S1 normal and S2 normal.   No murmur heard. Pulmonary/Chest: Effort normal. She has no wheezes. She has no rhonchi. She has no rales.  Post Xopenex neb:  Continues to be clear without adventious breath sounds.  Patient reports improved.  Abdominal: Soft. Normal appearance and bowel sounds are normal.  Musculoskeletal: She exhibits no edema.  Lymphadenopathy:    She has no cervical adenopathy.  Neurological: She is alert.  Skin: Skin is warm and intact. No rash noted.  No cyanosis. Nails show no clubbing.  No dermatographism.   Diagnostics: Spirometry:  FVC 2.32--79%, FEV1 1.88--85%; postbronchodilator essentially no change.  Skin testing: Strong reactivity to multiple grass pollens, selected weed and tree pollens, dust mite and mild reactivity to cockroach and Alternaria/Cladosporium mold mix; mild reactivity to shrimp and crab oyster lobster.    Sanita Estrada M. Willa Rough, MD   cc: Pershing Proud

## 2015-07-29 ENCOUNTER — Encounter: Payer: Self-pay | Admitting: Allergy and Immunology

## 2015-09-24 ENCOUNTER — Ambulatory Visit: Payer: 59 | Admitting: Allergy and Immunology

## 2016-01-17 ENCOUNTER — Encounter (HOSPITAL_COMMUNITY): Payer: Self-pay

## 2016-01-17 ENCOUNTER — Emergency Department (HOSPITAL_COMMUNITY)
Admission: EM | Admit: 2016-01-17 | Discharge: 2016-01-17 | Disposition: A | Discharge status: Home / Self Care | Payer: Self-pay | Attending: Emergency Medicine | Admitting: Emergency Medicine

## 2016-01-17 ENCOUNTER — Emergency Department (HOSPITAL_COMMUNITY): Payer: Self-pay

## 2016-01-17 DIAGNOSIS — E119 Type 2 diabetes mellitus without complications: Secondary | ICD-10-CM | POA: Insufficient documentation

## 2016-01-17 DIAGNOSIS — Z791 Long term (current) use of non-steroidal anti-inflammatories (NSAID): Secondary | ICD-10-CM | POA: Insufficient documentation

## 2016-01-17 DIAGNOSIS — B372 Candidiasis of skin and nail: Secondary | ICD-10-CM

## 2016-01-17 DIAGNOSIS — Z7951 Long term (current) use of inhaled steroids: Secondary | ICD-10-CM | POA: Insufficient documentation

## 2016-01-17 DIAGNOSIS — Z7984 Long term (current) use of oral hypoglycemic drugs: Secondary | ICD-10-CM | POA: Insufficient documentation

## 2016-01-17 DIAGNOSIS — R109 Unspecified abdominal pain: Secondary | ICD-10-CM

## 2016-01-17 DIAGNOSIS — Z79899 Other long term (current) drug therapy: Secondary | ICD-10-CM | POA: Insufficient documentation

## 2016-01-17 DIAGNOSIS — L308 Other specified dermatitis: Secondary | ICD-10-CM | POA: Insufficient documentation

## 2016-01-17 LAB — CBC WITH DIFFERENTIAL/PLATELET
BASOS ABS: 0 10*3/uL (ref 0.0–0.1)
BASOS PCT: 0 %
EOS ABS: 0.1 10*3/uL (ref 0.0–0.7)
EOS PCT: 1 %
HCT: 36.6 % (ref 36.0–46.0)
Hemoglobin: 11.9 g/dL — ABNORMAL LOW (ref 12.0–15.0)
LYMPHS PCT: 36 %
Lymphs Abs: 3 10*3/uL (ref 0.7–4.0)
MCH: 27.3 pg (ref 26.0–34.0)
MCHC: 32.5 g/dL (ref 30.0–36.0)
MCV: 83.9 fL (ref 78.0–100.0)
Monocytes Absolute: 0.5 10*3/uL (ref 0.1–1.0)
Monocytes Relative: 6 %
Neutro Abs: 4.8 10*3/uL (ref 1.7–7.7)
Neutrophils Relative %: 57 %
PLATELETS: 289 10*3/uL (ref 150–400)
RBC: 4.36 MIL/uL (ref 3.87–5.11)
RDW: 13 % (ref 11.5–15.5)
WBC: 8.4 10*3/uL (ref 4.0–10.5)

## 2016-01-17 LAB — URINALYSIS, ROUTINE W REFLEX MICROSCOPIC
Bilirubin Urine: NEGATIVE
Glucose, UA: NEGATIVE mg/dL
Hgb urine dipstick: NEGATIVE
Ketones, ur: NEGATIVE mg/dL
LEUKOCYTES UA: NEGATIVE
Nitrite: NEGATIVE
PROTEIN: NEGATIVE mg/dL
Specific Gravity, Urine: <1.005 — ABNORMAL LOW (ref 1.005–1.030)
pH: 6 (ref 5.0–8.0)

## 2016-01-17 LAB — COMPREHENSIVE METABOLIC PANEL
ALT: 21 U/L (ref 14–54)
AST: 25 U/L (ref 15–41)
Albumin: 4.4 g/dL (ref 3.5–5.0)
Alkaline Phosphatase: 60 U/L (ref 38–126)
Anion gap: 11 (ref 5–15)
BILIRUBIN TOTAL: 0.3 mg/dL (ref 0.3–1.2)
BUN: 15 mg/dL (ref 6–20)
CO2: 25 mmol/L (ref 22–32)
CREATININE: 1.07 mg/dL — AB (ref 0.44–1.00)
Calcium: 9.5 mg/dL (ref 8.9–10.3)
Chloride: 102 mmol/L (ref 101–111)
GFR calc Af Amer: >60 mL/min (ref >60)
GFR, EST NON AFRICAN AMERICAN: 59 mL/min — AB (ref >60)
Glucose, Bld: 155 mg/dL — ABNORMAL HIGH (ref 65–99)
POTASSIUM: 4.1 mmol/L (ref 3.5–5.1)
Sodium: 138 mmol/L (ref 135–145)
TOTAL PROTEIN: 8.3 g/dL — AB (ref 6.5–8.1)

## 2016-01-17 MED ORDER — CYCLOBENZAPRINE HCL 10 MG PO TABS
10.0000 mg | ORAL_TABLET | Freq: Once | ORAL | Status: AC
  Administered 2016-01-17: 10 mg via ORAL
  Filled 2016-01-17: qty 1

## 2016-01-17 MED ORDER — SODIUM CHLORIDE 0.9 % IV SOLN
INTRAVENOUS | Status: DC
  Administered 2016-01-17: 01:00:00 via INTRAVENOUS

## 2016-01-17 MED ORDER — ACETAMINOPHEN 325 MG PO TABS
650.0000 mg | ORAL_TABLET | Freq: Once | ORAL | Status: AC
  Administered 2016-01-17: 650 mg via ORAL
  Filled 2016-01-17: qty 2

## 2016-01-17 MED ORDER — NAPROXEN 500 MG PO TABS: ORAL_TABLET | ORAL | Status: DC

## 2016-01-17 MED ORDER — CYCLOBENZAPRINE HCL 5 MG PO TABS: 5.0000 mg | ORAL_TABLET | Freq: Three times a day (TID) | ORAL | Status: DC | PRN

## 2016-01-17 MED ORDER — TRAMADOL HCL 50 MG PO TABS: 100.0000 mg | ORAL_TABLET | Freq: Four times a day (QID) | ORAL | Status: DC | PRN

## 2016-01-17 MED ORDER — KETOROLAC TROMETHAMINE 30 MG/ML IJ SOLN
30.0000 mg | Freq: Once | INTRAMUSCULAR | Status: AC
  Administered 2016-01-17: 30 mg via INTRAVENOUS
  Filled 2016-01-17: qty 1

## 2016-01-17 MED ORDER — TRAMADOL HCL 50 MG PO TABS
100.0000 mg | ORAL_TABLET | Freq: Once | ORAL | Status: AC
  Administered 2016-01-17: 100 mg via ORAL
  Filled 2016-01-17: qty 2

## 2016-01-17 NOTE — ED Provider Notes (Signed)
CSN: [id number]     Arrival date & time 01/17/16  0004 History  By signing my name below, I, Linna DarnerRussell Turner, attest that this documentation has been prepared under the direction and in the presence of physician practitioner, Devoria AlbeIva Lanyah Spengler, MD at 00:13 AM. Electronically Signed: Linna Darnerussell Turner, Scribe. 01/17/2016. 12:14 AM.     Chief Complaint  Patient presents with  . Flank Pain    The history is provided by the patient. No language interpreter was used.     HPI Comments: Theresa Sullivan is a 50 y.o. female with PMHx of DM who presents to the Emergency Department complaining of constant, dull, aching, RLQ radiating into her right flank pain ongoing for the last week. She describes this pain as a pressure-like sensation also. She reports that she has experienced an intermittent, sharp, worsening pain in her RLQ since onset as well; she came to the ER tonight due to this pain. She endorses RLQ/right flank pain exacerbation with movement, bending, and lifting her legs. She notes no alleviating factors but has been taking OTC back and body pain medication. Pt also has concern over a "dark spot" that she noticed on her RLQ in association with her RLQ pain. Pt notes that she had surgery in this area to remove a fallopian tube. She states that the area is malodorous ("smells infected") and moist but denies drainage. She also reports increased urinary frequency since onset. She states that she has never experienced similar symptoms in the past. She uses Metformin x2 daily and states that it causes intermittent diarrhea. Pt does not smoke or drink. She is not currently employed. She reports that her blood sugar has not been elevated since onset; it has been in the 170's. Pt denies dysuria, nausea, vomiting, fever, new diarrhea, or any other associated symptoms.  [facility]  Past Medical History  Diagnosis Date  . Diabetes mellitus without complication [facility](HCC)    Past Surgical  History  Procedure Laterality Date  . Cholecystectomy    . Ablation     Family History  Problem Relation Age of Onset  . Asthma Mother   . Allergic rhinitis Neg Hx   . Angioedema Neg Hx   . Eczema Neg Hx   . Immunodeficiency Neg Hx   . Urticaria Neg Hx    Social History  Substance Use Topics  . Smoking status: Never Smoker   . Smokeless tobacco: None  . Alcohol Use: No   Unemployed Lives with spouse  OB History    No data available     Review of Systems  Constitutional: Negative for fever.  Gastrointestinal: Positive for abdominal pain (RLQ) and diarrhea (not associated with current symptoms; related to Metformin medication). Negative for nausea and vomiting.  Genitourinary: Positive for frequency and flank pain (right). Negative for dysuria.  Musculoskeletal: Positive for back pain (right lower).  All other systems reviewed and are negative.  Allergies  Shellfish allergy  Home Medications   Prior to Admission medications   Medication Sig Start Date End Date Taking? Authorizing Provider  Ascorbic Acid (VITAMIN C) 1000 MG tablet Take 1,000 mg by mouth daily.   Yes Historical Provider, MD  diazepam (VALIUM) 10 MG tablet TAKE 1 TABLET 3 TIMES A DAY AS NEEDED 07/15/15  Yes Historical Provider, MD  EPINEPHrine 0.3 mg/0.3 mL IJ SOAJ injection Inject 0.3 mLs (0.3 mg total) into the muscle once. For severe allergic reaction with difficulty breathing or swallowing 02/21/15  Yes  Glynn OctaveStephen Rancour, MD  fluticasone (FLONASE) 50 MCG/ACT nasal spray USE 2 SPRAYS INTO NOSTRIL EVERY DAY 07/15/15  Yes Historical Provider, MD  ibuprofen (ADVIL,MOTRIN) 400 MG tablet Take 400 mg by mouth every 6 (six) hours as needed.   Yes Historical Provider, MD  metFORMIN (GLUCOPHAGE) 1000 MG tablet Take 1,000 mg by mouth 2 (two) times daily. 07/15/15  Yes Historical Provider, MD  triamcinolone (NASACORT AQ) 55 MCG/ACT AERO nasal inhaler Place 2 sprays into the nose daily. 07/23/15  Yes Roselyn Kara MeadM Hicks, MD   zolpidem (AMBIEN) 10 MG tablet Take 10 mg by mouth at bedtime. 07/15/15  Yes Historical Provider, MD  albuterol (PROAIR HFA) 108 (90 Base) MCG/ACT inhaler Inhale 2 puffs into the lungs every 4 (four) hours as needed for wheezing or shortness of breath. 07/23/15   Roselyn Kara MeadM Hicks, MD  cyclobenzaprine (FLEXERIL) 5 MG tablet Take 1 tablet (5 mg total) by mouth 3 (three) times daily as needed (muscle soreness and pain). 01/17/16   Devoria AlbeIva Harinder Romas, MD  diphenhydrAMINE (BENADRYL) 25 MG tablet Take 1 tablet (25 mg total) by mouth every 6 (six) hours. 02/21/15   Glynn OctaveStephen Rancour, MD  ferrous sulfate 325 (65 FE) MG tablet Take 325 mg by mouth daily.    Historical Provider, MD  fexofenadine (ALLEGRA) 180 MG tablet Take 1 tablet (180 mg total) by mouth daily. 07/23/15   Roselyn Kara MeadM Hicks, MD  Krill Oil 300 MG CAPS Take 900 mg by mouth daily. Reported on 07/23/2015    Historical Provider, MD  naproxen (NAPROSYN) 500 MG tablet Take 1 po BID with food prn pain 01/17/16   Devoria AlbeIva Blossie Raffel, MD  predniSONE (DELTASONE) 50 MG tablet 1 tablet PO daily Patient not taking: Reported on 07/23/2015 02/21/15   Glynn OctaveStephen Rancour, MD  simvastatin (ZOCOR) 20 MG tablet Take 20 mg by mouth daily. 05/02/15   Historical Provider, MD  traMADol (ULTRAM) 50 MG tablet Take 2 tablets (100 mg total) by mouth every 6 (six) hours as needed. 01/17/16   Devoria AlbeIva Floriene Jeschke, MD   BP 185/99 mmHg  Pulse 86  Resp 18  Ht 5\' 4"  (1.626 m)  Wt 213 lb (96.616 kg)  BMI 36.54 kg/m2  SpO2 99%  Vital signs normal except hypertension  Physical Exam  Constitutional: She is oriented to person, place, and time. She appears well-developed and well-nourished.  Non-toxic appearance. She does not appear ill. No distress.  HENT:  Head: Normocephalic and atraumatic.  Right Ear: External ear normal.  Left Ear: External ear normal.  Nose: Nose normal. No mucosal edema or rhinorrhea.  Mouth/Throat: Oropharynx is clear and moist and mucous membranes are normal. No dental abscesses or uvula  swelling.  Eyes: Conjunctivae and EOM are normal. Pupils are equal, round, and reactive to light.  Neck: Normal range of motion and full passive range of motion without pain. Neck supple.  Cardiovascular: Normal rate, regular rhythm and normal heart sounds.  Exam reveals no gallop and no friction rub.   No murmur heard. Pulmonary/Chest: Effort normal and breath sounds normal. No respiratory distress. She has no wheezes. She has no rhonchi. She has no rales. She exhibits no tenderness and no crepitus.  Abdominal: Soft. Normal appearance and bowel sounds are normal. She exhibits no distension. There is tenderness. There is no rebound and no guarding.  Tenderness in the RLQ and in the right flank.  Musculoskeletal: Normal range of motion. She exhibits no edema or tenderness.  Moves all extremities well. Nontender in the midline lumbar spine.  Neurological: She is alert and oriented to person, place, and time. She has normal strength. No cranial nerve deficit.  Skin: Skin is warm, dry and intact. No rash noted. No erythema. No pallor.  Area of brown hyperpigmented skin under a fold in her panus.  Psychiatric: She has a normal mood and affect. Her speech is normal and behavior is normal. Her mood appears not anxious.  Nursing note and vitals reviewed.    ED Course  Procedures (including critical care time)  Medications  0.9 %  sodium chloride infusion ( Intravenous New Bag/Given 01/17/16 0052)  ketorolac (TORADOL) 30 MG/ML injection 30 mg (30 mg Intravenous Given 01/17/16 0052)  cyclobenzaprine (FLEXERIL) tablet 10 mg (10 mg Oral Given 01/17/16 0202)  traMADol (ULTRAM) tablet 100 mg (100 mg Oral Given 01/17/16 0202)  acetaminophen (TYLENOL) tablet 650 mg (650 mg Oral Given 01/17/16 0202)     DIAGNOSTIC STUDIES: Oxygen Saturation is 99% on RA, normal by my interpretation.    COORDINATION OF CARE: 12:14 AM Discussed treatment plan with pt at bedside and pt agreed to plan.   Patient was  given IV Toradol for her pain. Patient had a CT scan to look for renal stone or other intra-abdominal abnormality. This also shows she has a significant cellulitis of her abdominal wall.  2:05 AM patient was given her test results. We discussed her pain is most likely to be musculoskeletal. The area underneath her pannus is probably a monilial or yeast infection. He has been hot lately and she does have diabetes. We discussed keeping the area dry and to use topical antifungal's for that.    Labs Review Results for orders placed or performed during the hospital encounter of 01/17/16  Comprehensive metabolic panel  Result Value Ref Range   Sodium 138 135 - 145 mmol/L   Potassium 4.1 3.5 - 5.1 mmol/L   Chloride 102 101 - 111 mmol/L   CO2 25 22 - 32 mmol/L   Glucose, Bld 155 (H) 65 - 99 mg/dL   BUN 15 6 - 20 mg/dL   Creatinine, Ser 1.61 (H) 0.44 - 1.00 mg/dL   Calcium 9.5 8.9 - 09.6 mg/dL   Total Protein 8.3 (H) 6.5 - 8.1 g/dL   Albumin 4.4 3.5 - 5.0 g/dL   AST 25 15 - 41 U/L   ALT 21 14 - 54 U/L   Alkaline Phosphatase 60 38 - 126 U/L   Total Bilirubin 0.3 0.3 - 1.2 mg/dL   GFR calc non Af Amer 59 (L) >60 mL/min   GFR calc Af Amer >60 >60 mL/min   Anion gap 11 5 - 15  CBC with Differential  Result Value Ref Range   WBC 8.4 4.0 - 10.5 K/uL   RBC 4.36 3.87 - 5.11 MIL/uL   Hemoglobin 11.9 (L) 12.0 - 15.0 g/dL   HCT 04.5 40.9 - 81.1 %   MCV 83.9 78.0 - 100.0 fL   MCH 27.3 26.0 - 34.0 pg   MCHC 32.5 30.0 - 36.0 g/dL   RDW 91.4 78.2 - 95.6 %   Platelets 289 150 - 400 K/uL   Neutrophils Relative % 57 %   Neutro Abs 4.8 1.7 - 7.7 K/uL   Lymphocytes Relative 36 %   Lymphs Abs 3.0 0.7 - 4.0 K/uL   Monocytes Relative 6 %   Monocytes Absolute 0.5 0.1 - 1.0 K/uL   Eosinophils Relative 1 %   Eosinophils Absolute 0.1 0.0 - 0.7 K/uL   Basophils Relative 0 %  Basophils Absolute 0.0 0.0 - 0.1 K/uL  Urinalysis, Routine w reflex microscopic  Result Value Ref Range   Color, Urine STRAW (A)  YELLOW   APPearance CLEAR CLEAR   Specific Gravity, Urine <1.005 (L) 1.005 - 1.030   pH 6.0 5.0 - 8.0   Glucose, UA NEGATIVE NEGATIVE mg/dL   Hgb urine dipstick NEGATIVE NEGATIVE   Bilirubin Urine NEGATIVE NEGATIVE   Ketones, ur NEGATIVE NEGATIVE mg/dL   Protein, ur NEGATIVE NEGATIVE mg/dL   Nitrite NEGATIVE NEGATIVE   Leukocytes, UA NEGATIVE NEGATIVE   Laboratory interpretation all normal     Imaging Review Ct Renal Stone Study  01/17/2016  CLINICAL DATA:  Initial evaluation for acute right lower quadrant pain radiating into right flank. EXAM: CT ABDOMEN AND PELVIS WITHOUT CONTRAST TECHNIQUE: Multidetector CT imaging of the abdomen and pelvis was performed following the standard protocol without IV contrast. COMPARISON:  Prior CT from 03/25/2011. FINDINGS: Visualized lung bases are clear. Diffuse hypoattenuation of the liver consistent with steatosis. Liver otherwise unremarkable. Gallbladder surgically absent. No biliary dilatation. Spleen, adrenal glands, and pancreas demonstrate a normal unenhanced appearance. Kidneys are equal size without evidence of nephrolithiasis or hydronephrosis. No radiopaque calculi seen along the course of either renal collecting system. There is no hydroureter. 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 Stomach within normal limits. No evidence for bowel obstruction. No abnormal wall thickening or inflammatory fat stranding seen about the bowels. Appendix well visualized in the right lower quadrant and is normal in caliber and appearance without associated inflammatory changes to suggest acute appendicitis. Bladder partially distended without acute abnormality. Uterus and ovaries within normal limits. No free air or fluid. No pathologically enlarged intra-abdominal or pelvic lymph nodes. No acute osseous abnormality. No worrisome lytic or blastic osseous lesions. Bilateral facet arthrosis noted at L4-5 and L5-S1. IMPRESSION: 1. No CT evidence for nephrolithiasis or  obstructive uropathy. Duplicated right renal collecting system. 2. No other acute intra-abdominal or pelvic process. Normal appendix. 3. Hepatic steatosis. 4. Status post cholecystectomy. Electronically Signed   By: Rise Mu M.D.   On: 01/17/2016 01:47   I have personally reviewed and evaluated these images and lab results as part of my medical decision-making.    MDM   Final diagnoses:  Yeast dermatitis  Right flank pain   New Prescriptions   CYCLOBENZAPRINE (FLEXERIL) 5 MG TABLET    Take 1 tablet (5 mg total) by mouth 3 (three) times daily as needed (muscle soreness and pain).   NAPROXEN (NAPROSYN) 500 MG TABLET    Take 1 po BID with food prn pain   TRAMADOL (ULTRAM) 50 MG TABLET    Take 2 tablets (100 mg total) by mouth every 6 (six) hours as needed.  OTC topical antifungal lamisil  Plan discharge  Devoria Albe, MD, FACEP  I personally performed the services described in this documentation, which was scribed in my presence. The recorded information has been reviewed and considered.  Devoria Albe, MD, Concha Pyo, MD 01/17/16 [phone]

## 2016-01-17 NOTE — Discharge Instructions (Signed)
Use ice and heat on your right back for comfort. Take the medications as prescribed, you can add acetaminophen 650 mg with the tramadol. Use OTC lamisil or lotrimin to the dark area on your abdomen. Try to keep the area dry, moisture and heat will make the yeast get worse.   Recheck if you get a fever, vomiting, worsening pain.    Cutaneous Candidiasis Cutaneous candidiasis is a condition in which there is an overgrowth of yeast (candida) on the skin. Yeast normally live on the skin, but in small enough numbers not to cause any symptoms. In certain cases, increased growth of the yeast may cause an actual yeast infection. This kind of infection usually occurs in areas of the skin that are constantly warm and moist, such as the armpits or the groin. Yeast is the most common cause of diaper rash in babies and in people who cannot control their bowel movements (incontinence). CAUSES  The fungus that most often causes cutaneous candidiasis is Candida albicans. Conditions that can increase the risk of getting a yeast infection of the skin include:  Obesity.  Pregnancy.  Diabetes.  Taking antibiotic medicine.  Taking birth control pills.  Taking steroid medicines.  Thyroid disease.  An iron or zinc deficiency.  Problems with the immune system. SYMPTOMS   Red, swollen area of the skin.  Bumps on the skin.  Itchiness. DIAGNOSIS  The diagnosis of cutaneous candidiasis is usually based on its appearance. Light scrapings of the skin may also be taken and viewed under a microscope to identify the presence of yeast. TREATMENT  Antifungal creams may be applied to the infected skin. In severe cases, oral medicines may be needed.  HOME CARE INSTRUCTIONS   Keep your skin clean and dry.  Maintain a healthy weight.  If you have diabetes, keep your blood sugar under control. SEEK IMMEDIATE MEDICAL CARE IF:  Your rash continues to spread despite treatment.  You have a fever, chills, or  abdominal pain.   This information is not intended to replace advice given to you by your health care provider. Make sure you discuss any questions you have with your health care provider.   Document Released: 03/24/2011 Document Revised: 09/28/2011 Document Reviewed: 01/07/2015 Elsevier Interactive Patient Education [employer]2016 Elsevier Inc.

## 2016-01-17 NOTE — ED Notes (Signed)
Pt c/o "spot" in skin fold of RLQ that "smells infected".

## 2016-01-17 NOTE — ED Notes (Signed)
Pt report sharp pain to right lower abd and right flank/lower back x 5 days, states it has gotten worse recently

## 2016-01-17 NOTE — ED Notes (Signed)
Pt stated understanding of d/c instructions, prescriptions, and follow up care. Pt verbalized she would not drive this morning, family at bedside verbalized they would be driving.

## 2016-09-11 DIAGNOSIS — E782 Mixed hyperlipidemia: Secondary | ICD-10-CM | POA: Diagnosis not present

## 2016-09-11 DIAGNOSIS — Z1389 Encounter for screening for other disorder: Secondary | ICD-10-CM | POA: Diagnosis not present

## 2016-09-11 DIAGNOSIS — Z0000 Encounter for general adult medical examination without abnormal findings: Secondary | ICD-10-CM | POA: Diagnosis not present

## 2016-09-11 DIAGNOSIS — G4700 Insomnia, unspecified: Secondary | ICD-10-CM | POA: Diagnosis not present

## 2016-09-11 DIAGNOSIS — E1165 Type 2 diabetes mellitus with hyperglycemia: Secondary | ICD-10-CM | POA: Diagnosis not present

## 2016-10-13 ENCOUNTER — Other Ambulatory Visit (HOSPITAL_COMMUNITY): Payer: Self-pay | Admitting: Family Medicine

## 2016-12-04 DIAGNOSIS — E1165 Type 2 diabetes mellitus with hyperglycemia: Secondary | ICD-10-CM | POA: Diagnosis not present

## 2016-12-10 DIAGNOSIS — Z1211 Encounter for screening for malignant neoplasm of colon: Secondary | ICD-10-CM | POA: Diagnosis not present

## 2017-01-19 ENCOUNTER — Other Ambulatory Visit (HOSPITAL_COMMUNITY): Payer: Self-pay | Admitting: Registered Nurse

## 2017-01-19 DIAGNOSIS — Z1231 Encounter for screening mammogram for malignant neoplasm of breast: Secondary | ICD-10-CM

## 2017-06-03 DIAGNOSIS — I10 Essential (primary) hypertension: Secondary | ICD-10-CM | POA: Diagnosis not present

## 2017-06-03 DIAGNOSIS — E1165 Type 2 diabetes mellitus with hyperglycemia: Secondary | ICD-10-CM | POA: Diagnosis not present

## 2017-08-28 ENCOUNTER — Encounter (HOSPITAL_COMMUNITY): Payer: Self-pay | Admitting: *Deleted

## 2017-08-28 ENCOUNTER — Emergency Department (HOSPITAL_COMMUNITY)
Admission: EM | Admit: 2017-08-28 | Discharge: 2017-08-29 | Disposition: A | Discharge status: Home / Self Care | Payer: 59 | Attending: Emergency Medicine | Admitting: Emergency Medicine

## 2017-08-28 ENCOUNTER — Other Ambulatory Visit: Payer: Self-pay

## 2017-08-28 DIAGNOSIS — Z7984 Long term (current) use of oral hypoglycemic drugs: Secondary | ICD-10-CM | POA: Diagnosis not present

## 2017-08-28 DIAGNOSIS — T7840XA Allergy, unspecified, initial encounter: Secondary | ICD-10-CM | POA: Diagnosis not present

## 2017-08-28 DIAGNOSIS — Z79899 Other long term (current) drug therapy: Secondary | ICD-10-CM | POA: Diagnosis not present

## 2017-08-28 DIAGNOSIS — E119 Type 2 diabetes mellitus without complications: Secondary | ICD-10-CM | POA: Diagnosis not present

## 2017-08-28 DIAGNOSIS — R21 Rash and other nonspecific skin eruption: Secondary | ICD-10-CM | POA: Diagnosis not present

## 2017-08-28 MED ORDER — METHYLPREDNISOLONE SODIUM SUCC 125 MG IJ SOLR
INTRAMUSCULAR | Status: AC
  Filled 2017-08-28: qty 2

## 2017-08-28 MED ORDER — FAMOTIDINE IN NACL 20-0.9 MG/50ML-% IV SOLN
INTRAVENOUS | Status: AC
  Filled 2017-08-28: qty 50

## 2017-08-28 MED ORDER — FAMOTIDINE 20 MG PO TABS: 20.0000 mg | ORAL_TABLET | Freq: Two times a day (BID) | ORAL | 0 refills | Status: DC

## 2017-08-28 MED ORDER — DIPHENHYDRAMINE HCL 25 MG PO TABS: 25.0000 mg | ORAL_TABLET | Freq: Three times a day (TID) | ORAL | 0 refills | Status: DC

## 2017-08-28 MED ORDER — DIPHENHYDRAMINE HCL 50 MG/ML IJ SOLN
25.0000 mg | Freq: Once | INTRAMUSCULAR | Status: AC
  Administered 2017-08-28: 25 mg via INTRAVENOUS
  Filled 2017-08-28: qty 1

## 2017-08-28 MED ORDER — HYDROXYZINE HCL 25 MG PO TABS
25.0000 mg | ORAL_TABLET | Freq: Once | ORAL | Status: AC
  Administered 2017-08-28: 25 mg via ORAL
  Filled 2017-08-28: qty 1

## 2017-08-28 MED ORDER — METHYLPREDNISOLONE SODIUM SUCC 125 MG IJ SOLR
125.0000 mg | Freq: Once | INTRAMUSCULAR | Status: AC
  Administered 2017-08-28: 125 mg via INTRAVENOUS
  Filled 2017-08-28: qty 2

## 2017-08-28 MED ORDER — EPINEPHRINE 0.3 MG/0.3ML IJ SOAJ: 0.3000 mg | Freq: Once | INTRAMUSCULAR | 0 refills | Status: AC

## 2017-08-28 MED ORDER — DIPHENHYDRAMINE HCL 50 MG/ML IJ SOLN
INTRAMUSCULAR | Status: AC
  Filled 2017-08-28: qty 1

## 2017-08-28 MED ORDER — FAMOTIDINE IN NACL 20-0.9 MG/50ML-% IV SOLN
20.0000 mg | Freq: Once | INTRAVENOUS | Status: AC
  Administered 2017-08-28: 20 mg via INTRAVENOUS
  Filled 2017-08-28: qty 50

## 2017-08-28 NOTE — ED Triage Notes (Signed)
Pt is allergic to shellfish an had shrimp around 5 pm. About an hour ago, pt broke out in hives on her neck, face, chest, arms and hands. Pt states she has been eating shrimp for awhile and she believes she is only allergic to oysters and trout. Pt did however eat at [employer] and is not sure if the grease was cooked with one of these. Pt took 2 benadryl and a claritan around 30 mins ago.

## 2017-08-28 NOTE — Discharge Instructions (Signed)
As discussed, you are suffering from allergic reaction. If you develop new, or concerning changes, please be sure to return here immediately.  Please fill your prescription for epinephrine, and if you need to use this due to a similar reaction, please be sure to proceed to the emergency department afterwards.  In addition to epi-pen, please use Benadryl, 25 mg, and Pepcid, 20 mg for future reactions.  You do not need to fill the prescription for Benadryl or Pepcid and may use over-the-counter tablets, but use the instructions to ensure that you are taking them appropriately for the next 3 days.  Otherwise, please be sure to follow-up with her primary care physician.

## 2017-08-28 NOTE — ED Provider Notes (Signed)
MSE was initiated and I personally evaluated the patient and placed orders (if any) at  9:11 PM on August 28, 2017.  The patient appears stable so that the remainder of the MSE may be completed by another provider.  Patient with known shellfish allergy, but only to oysters as she is aware, ate shrimp around 5 PM this evening and developed itching and rash on her hands face and chest an hour before arrival.  She denies shortness of breath, mouth or tongue or throat swelling at this time, however states her lips feel tingly denies wheezing.  Has had 2 Benadryl and one Claritin tablet 30 minutes before arrival without improvement in symptoms.  Physical exam: Patient is not currently wheezing, equal breath sounds without prolonged expirations.  Blotchy erythematous macules on face and hands.    Will start IV in triage, ordered Solu-Medrol, IV Pepcid and additional IV Benadryl while patient is awaiting exam room.   Theresa Sullivan, Theresa Hodgkin, PA-C 08/28/17 2114    Gerhard MunchLockwood, Robert, MD 08/28/17 2142

## 2017-08-28 NOTE — ED Provider Notes (Signed)
[facility] EMERGENCY DEPARTMENT Provider Note   CSN: [id number] Arrival date & time: 08/28/17  2054     History   Chief Complaint Chief Complaint  Patient presents with  . Allergic Reaction    HPI Theresa Sullivan is a 52 y.o. female.  HPI Patient presents with concern of facial tingling, hives, arm numbness. Patient notes a history of allergy to oysters and tract. However, she has no allergies to shrimp. Tonight, after a shrimp dinner at a seafood restaurant, about 2 hours later she developed itching, hives, and her upper extremities, and face. Symptoms progressed in spite of using Benadryl and Zantac. Patient has had allergic reactions, notes that this is somewhat different in that she has no throat fullness currently. She notes that she was well prior to going to dinner, and states that she is generally healthy. She is here with 2 family members who assist with the HPI, corroborate the details.  On arrival to the emergency department the patient received Solu-Medrol, Pepcid, Benadryl, was placed on continuous oxygen monitoring. Past Medical History:  Diagnosis Date  . Diabetes mellitus without complication (HCC)     There are no active problems to display for this patient.   Past Surgical History:  Procedure Laterality Date  . ABLATION    . CHOLECYSTECTOMY      OB History    No data available       Home Medications    Prior to Admission medications   Medication Sig Start Date End Date Taking? Authorizing Provider  albuterol (PROAIR HFA) 108 (90 Base) MCG/ACT inhaler Inhale 2 puffs into the lungs every 4 (four) hours as needed for wheezing or shortness of breath. 07/23/15   Baxter Hire, MD  Ascorbic Acid (VITAMIN C) 1000 MG tablet Take 1,000 mg by mouth daily.    [provider]  cyclobenzaprine (FLEXERIL) 5 MG tablet Take 1 tablet (5 mg total) by mouth 3 (three) times daily as needed (muscle soreness and pain). 01/17/16   Devoria Albe, MD  diazepam  (VALIUM) 10 MG tablet TAKE 1 TABLET 3 TIMES A DAY AS NEEDED 07/15/15   [provider]  diphenhydrAMINE (BENADRYL) 25 MG tablet Take 1 tablet (25 mg total) by mouth every 6 (six) hours. 02/21/15   Rancour, Jeannett Senior, MD  EPINEPHrine 0.3 mg/0.3 mL IJ SOAJ injection Inject 0.3 mLs (0.3 mg total) into the muscle once. For severe allergic reaction with difficulty breathing or swallowing 02/21/15   Rancour, Stephen, MD  ferrous sulfate 325 (65 FE) MG tablet Take 325 mg by mouth daily.    [provider]  fexofenadine (ALLEGRA) 180 MG tablet Take 1 tablet (180 mg total) by mouth daily. 07/23/15   Baxter Hire, MD  fluticasone (FLONASE) 50 MCG/ACT nasal spray USE 2 SPRAYS INTO NOSTRIL EVERY DAY 07/15/15   [provider]  ibuprofen (ADVIL,MOTRIN) 400 MG tablet Take 400 mg by mouth every 6 (six) hours as needed.    [provider]  Boris Lown Oil 300 MG CAPS Take 900 mg by mouth daily. Reported on 07/23/2015    [provider]  metFORMIN (GLUCOPHAGE) 1000 MG tablet Take 1,000 mg by mouth 2 (two) times daily. 07/15/15   [provider]  naproxen (NAPROSYN) 500 MG tablet Take 1 po BID with food prn pain 01/17/16   Devoria Albe, MD  predniSONE (DELTASONE) 50 MG tablet 1 tablet PO daily Patient not taking: Reported on 07/23/2015 02/21/15   Glynn Octave, MD  simvastatin (ZOCOR) 20 MG  tablet Take 20 mg by mouth daily. 05/02/15   [provider]  traMADol (ULTRAM) 50 MG tablet Take 2 tablets (100 mg total) by mouth every 6 (six) hours as needed. 01/17/16   Devoria Albe, MD  triamcinolone (NASACORT AQ) 55 MCG/ACT AERO nasal inhaler Place 2 sprays into the nose daily. 07/23/15   Baxter Hire, MD  zolpidem (AMBIEN) 10 MG tablet Take 10 mg by mouth at bedtime. 07/15/15   [provider]    Family History Family History  Problem Relation Age of Onset  . Asthma Mother   . Allergic rhinitis Neg Hx   . Angioedema Neg Hx   . Eczema Neg Hx   . Immunodeficiency  Neg Hx   . Urticaria Neg Hx     Social History Social History   Tobacco Use  . Smoking status: Never Smoker  . Smokeless tobacco: Never Used  Substance Use Topics  . Alcohol use: No  . Drug use: No     Allergies   Shellfish allergy   Review of Systems Review of Systems  Constitutional:       Per HPI, otherwise negative  HENT:       Per HPI, otherwise negative  Respiratory:       Per HPI, otherwise negative  Cardiovascular:       Per HPI, otherwise negative  Gastrointestinal: Negative for vomiting.  Endocrine:       Negative aside from HPI  Genitourinary:       Neg aside from HPI   Musculoskeletal:       Per HPI, otherwise negative  Skin: Positive for rash.  Allergic/Immunologic: Positive for food allergies. Negative for immunocompromised state.  Neurological: Negative for syncope.     Physical Exam Updated Vital Signs BP (!) 178/92 (BP Location: Right Arm)   Pulse 86   Temp 97.9 F (36.6 C) (Oral)   Resp 18   Ht 5\' 4"  (1.626 m)   Wt 94.3 kg (208 lb)   SpO2 100%   BMI 35.70 kg/m   Physical Exam  Constitutional: She is oriented to person, place, and time. She appears well-developed and well-nourished. No distress.  HENT:  Head: Normocephalic and atraumatic.  Mouth/Throat: Uvula is midline and oropharynx is clear and moist.    Eyes: Conjunctivae and EOM are normal.  Cardiovascular: Normal rate and regular rhythm.  Pulmonary/Chest: Breath sounds normal. No stridor.  Musculoskeletal: She exhibits no edema.  Neurological: She is alert and oriented to person, place, and time. No cranial nerve deficit.  Skin: Skin is warm and dry. Rash noted.  Urticarial lesion on the lower face and neck, mild erythema on both lateral distal upper extremities  Psychiatric: She has a normal mood and affect.  Nursing note and vitals reviewed.    ED Treatments / Results   Procedures Procedures (including critical care time)  Medications Ordered in ED Medications    famotidine (PEPCID) IVPB 20 mg premix (20 mg Intravenous New Bag/Given 08/28/17 2123)  hydrOXYzine (ATARAX/VISTARIL) tablet 25 mg (not administered)  methylPREDNISolone sodium succinate (SOLU-MEDROL) 125 mg/2 mL injection 125 mg (125 mg Intravenous Given 08/28/17 2123)  diphenhydrAMINE (BENADRYL) injection 25 mg (25 mg Intravenous Given 08/28/17 2123)     Initial Impression / Assessment and Plan / ED Course  I have reviewed the triage vital signs and the nursing notes.  Pertinent labs & imaging results that were available during my care of the patient were reviewed by me and considered in my medical decision making (  see chart for details).  After the initial evaluation patient received Solu-Medrol, Pepcid, Benadryl, and Atarax due to persistent itching in spite of these initial interventions. Patient's initial evaluation does not demonstrate oral pharyngeal compromise, impending respiratory distress, but given concern for allergic reaction, patient will require monitoring. She and I discussed the importance of filling an EpiPen prescription, on discharge, and the need to return if she has to use said prescription. If the patient remains awake and alert, with improvement in her condition, she will be discharged with outpatient follow-up with primary care.  Final Clinical Impressions(s) / ED Diagnoses  Allergic reaction   Gerhard MunchLockwood, Torryn Fiske, MD 08/28/17 2146

## 2017-08-29 NOTE — ED Provider Notes (Signed)
Patient symptoms resolved at the time of discharge.  Patient stable for discharge home.   Vanetta MuldersZackowski, Stefanny Pieri, MD 08/29/17 Marlyne Beards0002

## 2018-01-10 DIAGNOSIS — E1165 Type 2 diabetes mellitus with hyperglycemia: Secondary | ICD-10-CM | POA: Diagnosis not present

## 2018-01-10 DIAGNOSIS — Z1389 Encounter for screening for other disorder: Secondary | ICD-10-CM | POA: Diagnosis not present

## 2018-01-28 DIAGNOSIS — E1165 Type 2 diabetes mellitus with hyperglycemia: Secondary | ICD-10-CM | POA: Diagnosis not present

## 2018-01-28 DIAGNOSIS — E782 Mixed hyperlipidemia: Secondary | ICD-10-CM | POA: Diagnosis not present

## 2018-01-28 DIAGNOSIS — G44209 Tension-type headache, unspecified, not intractable: Secondary | ICD-10-CM | POA: Diagnosis not present

## 2018-02-10 DIAGNOSIS — M542 Cervicalgia: Secondary | ICD-10-CM | POA: Diagnosis not present

## 2018-03-07 DIAGNOSIS — M2560 Stiffness of unspecified joint, not elsewhere classified: Secondary | ICD-10-CM | POA: Diagnosis not present

## 2018-03-07 DIAGNOSIS — M542 Cervicalgia: Secondary | ICD-10-CM | POA: Diagnosis not present

## 2018-03-07 DIAGNOSIS — M62838 Other muscle spasm: Secondary | ICD-10-CM | POA: Diagnosis not present

## 2018-03-09 DIAGNOSIS — M62838 Other muscle spasm: Secondary | ICD-10-CM | POA: Diagnosis not present

## 2018-03-09 DIAGNOSIS — M542 Cervicalgia: Secondary | ICD-10-CM | POA: Diagnosis not present

## 2018-03-09 DIAGNOSIS — M2560 Stiffness of unspecified joint, not elsewhere classified: Secondary | ICD-10-CM | POA: Diagnosis not present

## 2018-03-11 DIAGNOSIS — M2560 Stiffness of unspecified joint, not elsewhere classified: Secondary | ICD-10-CM | POA: Diagnosis not present

## 2018-03-11 DIAGNOSIS — M542 Cervicalgia: Secondary | ICD-10-CM | POA: Diagnosis not present

## 2018-03-11 DIAGNOSIS — M62838 Other muscle spasm: Secondary | ICD-10-CM | POA: Diagnosis not present

## 2018-08-19 DIAGNOSIS — N959 Unspecified menopausal and perimenopausal disorder: Secondary | ICD-10-CM | POA: Diagnosis not present

## 2018-08-19 DIAGNOSIS — E1165 Type 2 diabetes mellitus with hyperglycemia: Secondary | ICD-10-CM | POA: Diagnosis not present

## 2018-08-23 DIAGNOSIS — N959 Unspecified menopausal and perimenopausal disorder: Secondary | ICD-10-CM | POA: Diagnosis not present

## 2018-08-23 DIAGNOSIS — E1165 Type 2 diabetes mellitus with hyperglycemia: Secondary | ICD-10-CM | POA: Diagnosis not present

## 2019-06-17 ENCOUNTER — Emergency Department (HOSPITAL_COMMUNITY)
Admission: EM | Admit: 2019-06-17 | Discharge: 2019-06-17 | Disposition: A | Discharge status: Home / Self Care | Payer: 59 | Attending: Emergency Medicine | Admitting: Emergency Medicine

## 2019-06-17 ENCOUNTER — Other Ambulatory Visit: Payer: Self-pay

## 2019-06-17 ENCOUNTER — Encounter (HOSPITAL_COMMUNITY): Payer: Self-pay | Admitting: *Deleted

## 2019-06-17 DIAGNOSIS — M5442 Lumbago with sciatica, left side: Secondary | ICD-10-CM | POA: Diagnosis not present

## 2019-06-17 DIAGNOSIS — E119 Type 2 diabetes mellitus without complications: Secondary | ICD-10-CM | POA: Diagnosis not present

## 2019-06-17 DIAGNOSIS — M545 Low back pain: Secondary | ICD-10-CM | POA: Diagnosis present

## 2019-06-17 DIAGNOSIS — M5432 Sciatica, left side: Secondary | ICD-10-CM

## 2019-06-17 DIAGNOSIS — Z79899 Other long term (current) drug therapy: Secondary | ICD-10-CM | POA: Insufficient documentation

## 2019-06-17 MED ORDER — PREDNISONE 20 MG PO TABS
40.0000 mg | ORAL_TABLET | Freq: Once | ORAL | Status: AC
  Administered 2019-06-17: 40 mg via ORAL
  Filled 2019-06-17: qty 2

## 2019-06-17 MED ORDER — HYDROCODONE-ACETAMINOPHEN 5-325 MG PO TABS: 1.0000 | ORAL_TABLET | Freq: Four times a day (QID) | ORAL | 0 refills | Status: DC | PRN

## 2019-06-17 MED ORDER — HYDROCODONE-ACETAMINOPHEN 5-325 MG PO TABS
2.0000 | ORAL_TABLET | Freq: Once | ORAL | Status: AC
  Administered 2019-06-17: 2 via ORAL
  Filled 2019-06-17: qty 2

## 2019-06-17 MED ORDER — PREDNISONE 10 MG PO TABS: 20.0000 mg | ORAL_TABLET | Freq: Two times a day (BID) | ORAL | 0 refills | Status: DC

## 2019-06-17 NOTE — ED Notes (Signed)
ED Provider at bedside. 

## 2019-06-17 NOTE — ED Provider Notes (Signed)
[facility] EMERGENCY DEPARTMENT Provider Note   CSN: [id number] Arrival date & time: 06/17/19  0102     History   Chief Complaint Chief Complaint  Patient presents with  . Back Pain    HPI Theresa Sullivan is a 53 y.o. female.     Patient is a 53 year old female with history of type 2 diabetes.  She presents today for evaluation of pain in her left buttock.  This began several days ago and is worsening.  She describes a burning pain to the back of her left buttock that radiates down the back of her thigh.  It is worse with sitting, standing, or walking.  She denies any weakness or numbness.  She denies any bowel or bladder complaints.  She denies any specific injury or trauma.  The history is provided by the patient.  Back Pain Location:  Gluteal region Quality:  Burning Radiates to:  L thigh Pain severity:  Severe Onset quality:  Gradual Duration:  3 days Timing:  Constant Progression:  Worsening Chronicity:  New Context comment:  None Relieved by:  Nothing Worsened by:  Palpation, ambulation, movement and sitting Ineffective treatments:  None tried   Past Medical History:  Diagnosis Date  . Diabetes mellitus without complication (HCC)     There are no active problems to display for this patient.   Past Surgical History:  Procedure Laterality Date  . ABLATION    . CHOLECYSTECTOMY       OB History   No obstetric history on file.      Home Medications    Prior to Admission medications   Medication Sig Start Date End Date Taking? Authorizing Provider  diphenhydrAMINE (BENADRYL) 25 MG tablet Take 1 tablet (25 mg total) by mouth 3 (three) times daily. Take one tablet three times daily for two days 08/28/17   Gerhard Munch, MD  famotidine (PEPCID) 20 MG tablet Take 1 tablet (20 mg total) by mouth 2 (two) times daily. Take one tablet twice daily for two days 08/28/17   Gerhard Munch, MD  Flaxseed, Linseed, (FLAX SEEDS PO) Take 1 tablet by mouth daily.     [provider]    Family History Family History  Problem Relation Age of Onset  . Asthma Mother   . Allergic rhinitis Neg Hx   . Angioedema Neg Hx   . Eczema Neg Hx   . Immunodeficiency Neg Hx   . Urticaria Neg Hx     Social History Social History   Tobacco Use  . Smoking status: Never Smoker  . Smokeless tobacco: Never Used  Substance Use Topics  . Alcohol use: No  . Drug use: No     Allergies   Shellfish allergy   Review of Systems Review of Systems  Musculoskeletal: Positive for back pain.  All other systems reviewed and are negative.    Physical Exam Updated Vital Signs BP (!) 150/101   Pulse (!) 102   Temp 98.2 F (36.8 C) (Oral)   Resp 18   Ht 5\' 5"  (1.651 m)   Wt 95.3 kg   SpO2 96%   BMI 34.95 kg/m   Physical Exam Vitals signs and nursing note reviewed.  Constitutional:      General: She is not in acute distress.    Appearance: Normal appearance. She is not ill-appearing.  HENT:     Head: Normocephalic and atraumatic.  Pulmonary:     Effort: Pulmonary effort is normal.  Musculoskeletal:     Comments:  There is tenderness to palpation in the upper gluteal region.  There is no obvious abnormality visible.  Skin:    General: Skin is warm and dry.  Neurological:     Mental Status: She is alert and oriented to person, place, and time.     Comments: Strength is 5 out of 5 in both lower extremities.  DTRs are trace and symmetrical in the patellar and Achilles tendons bilaterally.  She is ambulatory, but with a limp.      ED Treatments / Results  Labs (all labs ordered are listed, but only abnormal results are displayed) Labs Reviewed - No data to display  EKG None  Radiology No results found.  Procedures Procedures (including critical care time)  Medications Ordered in ED Medications  HYDROcodone-acetaminophen (NORCO/VICODIN) 5-325 MG per tablet 2 tablet (has no administration in time range)  predniSONE (DELTASONE) tablet  40 mg (has no administration in time range)     Initial Impression / Assessment and Plan / ED Course  I have reviewed the triage vital signs and the nursing notes.  Pertinent labs & imaging results that were available during my care of the patient were reviewed by me and considered in my medical decision making (see chart for details).  Patient's pain appears to be related to sciatica.  There are no red flags that would suggest an emergently surgical problem.  Patient will be treated with prednisone, pain medication, and is to follow-up with primary doctor if not improving.  Final Clinical Impressions(s) / ED Diagnoses   Final diagnoses:  None    ED Discharge Orders    None       Veryl Speak, MD 06/17/19 [phone]

## 2019-06-17 NOTE — Discharge Instructions (Addendum)
Begin taking prednisone as prescribed.  Hydrocodone as prescribed as needed for pain.  Follow-up with primary doctor if symptoms or not improving in the next week.

## 2019-06-17 NOTE — ED Triage Notes (Signed)
Pt c/o left side lower back pain that radiates down left buttock region to left leg, denies any injury,

## 2019-08-16 ENCOUNTER — Other Ambulatory Visit: Payer: Self-pay

## 2019-08-16 ENCOUNTER — Ambulatory Visit: Payer: 59 | Attending: Internal Medicine

## 2019-08-16 DIAGNOSIS — Z20822 Contact with and (suspected) exposure to covid-19: Secondary | ICD-10-CM

## 2019-08-17 LAB — NOVEL CORONAVIRUS, NAA: SARS-CoV-2, NAA: DETECTED — AB

## 2019-08-18 ENCOUNTER — Telehealth: Payer: Self-pay | Admitting: Adult Health

## 2019-08-18 NOTE — Telephone Encounter (Signed)
Contacted patient regarding her COVID-19 positive test results.  Patient still has symptoms of fatigue and weakness.  We discussed that she is in a high risk category with her medical history of diabetes.  Went over treatment options of monoclonal antibody infusion.  Patient would like to think about this and call us back if she is interested.  Infusion center number was given.   Daria Mcmeekin NP-C  [facility]   08/18/2019

## 2020-04-09 ENCOUNTER — Encounter (INDEPENDENT_AMBULATORY_CARE_PROVIDER_SITE_OTHER): Payer: Self-pay | Admitting: Gastroenterology

## 2020-04-29 ENCOUNTER — Encounter (INDEPENDENT_AMBULATORY_CARE_PROVIDER_SITE_OTHER): Payer: Self-pay | Admitting: Gastroenterology

## 2020-04-29 ENCOUNTER — Ambulatory Visit (INDEPENDENT_AMBULATORY_CARE_PROVIDER_SITE_OTHER): Payer: 59 | Admitting: Gastroenterology

## 2020-04-29 ENCOUNTER — Other Ambulatory Visit: Payer: Self-pay

## 2020-04-29 DIAGNOSIS — Z1211 Encounter for screening for malignant neoplasm of colon: Secondary | ICD-10-CM | POA: Insufficient documentation

## 2020-04-29 DIAGNOSIS — Z1212 Encounter for screening for malignant neoplasm of rectum: Secondary | ICD-10-CM | POA: Diagnosis not present

## 2020-04-29 DIAGNOSIS — K5903 Drug induced constipation: Secondary | ICD-10-CM

## 2020-04-29 DIAGNOSIS — K5900 Constipation, unspecified: Secondary | ICD-10-CM | POA: Insufficient documentation

## 2020-04-29 NOTE — Progress Notes (Signed)
Theresa Sullivan, M.D. Gastroenterology & Hepatology [facility] [address] [place], [place] [address] Primary Care Physician: Theresa Sullivan [address] [place] [address]  Referring MD: PCP  I will communicate my assessment and recommendations to the referring MD via EMR. Note: Occasional unusual wording and randomly placed punctuation marks may result from the use of speech recognition technology to transcribe this document"  Chief Complaint:  constipation  History of Present Illness: Theresa Sullivan is a 54 y.o. female with PMH diabetes mellitus, who presents for evaluation of constipation.  Patient reports that for the last year she has been presenting constipation. If she does not take any medication, she has a BM every 4-5 days. She hstates she has to strain significantly to have a BM, which causes significant dyschezia. Denies any blood in the stool usually but sometimes when wiping after straining for a long time. She has been having nausea Sullivan/o vomiting which she believes may be related to Trulicity - she takes zofran as needed 1-2 times a day. States having bloating sensation when she has not moved her bowels, but denies having any abdominal pain when this happens.  She has been taking Benefiber once to twice a day for the last 2 weeks. She is going every other day, but sometimes every day. Feels her bowel movement frequency is better and has to strain less than in the past. Has also taken Purelax 17 g in the past (not anymore) but did not notice a big difference as she used it only for a few days.  The patient denies having any fever, melena, hematemesis,  abdominal pain, diarrhea, jaundice, pruritus or unintentional weight loss.  Last WLN:[id number] Last Colonoscopy:never  FHx: neg for any gastrointestinal/liver disease, aunt had cancer but unknown type Social: neg smoking, alcohol or illicit drug  use Surgical: cholecystectomy, uterine ablation  Past Medical History: Past Medical History:  Diagnosis Date  . Diabetes mellitus without complication [facility])     Past Surgical History: Past Surgical History:  Procedure Laterality Date  . ABLATION    . CHOLECYSTECTOMY      Family History: Family History  Problem Relation Age of Onset  . Asthma Mother   . Diabetes Father   . Allergic rhinitis Neg Hx   . Angioedema Neg Hx   . Eczema Neg Hx   . Immunodeficiency Neg Hx   . Urticaria Neg Hx     Social History: Social History   Tobacco Use  Smoking Status Never Smoker  Smokeless Tobacco Never Used   Social History   Substance and Sexual Activity  Alcohol Use No   Social History   Substance and Sexual Activity  Drug Use No    Allergies: Allergies  Allergen Reactions  . Shellfish Allergy Swelling    Mostly trout and oysters    Medications: Current Outpatient Medications  Medication Sig Dispense Refill  . Cholecalciferol 125 MCG (5000 UT) capsule Take 10,000 Units by mouth daily.    . citalopram (CELEXA) 20 MG tablet Take 20 mg by mouth daily.    . cyclobenzaprine (FLEXERIL) 10 MG tablet Take 10 mg by mouth in the morning and at bedtime.    . diazepam (VALIUM) 10 MG tablet Take 10 mg by mouth every 6 (six) hours as needed for anxiety.    . diphenhydrAMINE (BENADRYL) 25 MG tablet Take 1 tablet (25 mg total) by mouth 3 (three) times daily. Take one tablet three times daily for two  days 10 tablet 0  . Dulaglutide (TRULICITY) 3 MG/0.5ML SOPN Inject 3 mg into the skin once a week.    Marland Kitchen lisinopril (ZESTRIL) 2.5 MG tablet Take 2.5 mg by mouth daily.    . Multiple Vitamin (MULTIVITAMIN) capsule Take 1 capsule by mouth daily.    . ondansetron (ZOFRAN) 4 MG tablet Take 4 mg by mouth every 8 (eight) hours as needed for nausea or vomiting.    . Wheat Dextrin (BENEFIBER PO) Take 17 g by mouth.     No current facility-administered medications for this visit.    Review of  Systems: GENERAL: negative for malaise, night sweats HEENT: No changes in hearing or vision, no nose bleeds or other nasal problems. NECK: Negative for lumps, goiter, pain and significant neck swelling RESPIRATORY: Negative for cough, wheezing CARDIOVASCULAR: Negative for chest pain, leg swelling, palpitations, orthopnea GI: SEE HPI MUSCULOSKELETAL: Negative for joint pain or swelling, back pain, and muscle pain. SKIN: Negative for lesions, rash PSYCH: Negative for sleep disturbance, mood disorder and recent psychosocial stressors. HEMATOLOGY Negative for prolonged bleeding, bruising easily, and swollen nodes. ENDOCRINE: Negative for cold or heat intolerance, polyuria, polydipsia and goiter. NEURO: negative for tremor, gait imbalance, syncope and seizures. The remainder of the review of systems is noncontributory.   Physical Exam: BP 138/85 (BP Location: Right Arm, Patient Position: Sitting, Cuff Size: Normal)   Pulse 96   Temp 98 F (36.7 C) (Oral)   Ht 5\' 5"  (1.651 m)   Wt 208 lb (94.3 kg)   BMI 34.61 kg/m  GENERAL: The patient is AO x3, in no acute distress. Obese. HEENT: Head is normocephalic and atraumatic. EOMI are intact. Mouth is well hydrated and without lesions. NECK: Supple. No masses LUNGS: Clear to auscultation. No presence of rhonchi/wheezing/rales. Adequate chest expansion HEART: RRR, normal s1 and s2. ABDOMEN: Soft, nontender, no guarding, no peritoneal signs, and nondistended. BS +. No masses. EXTREMITIES: Without any cyanosis, clubbing, rash, lesions or edema. NEUROLOGIC: AOx3, no focal motor deficit. SKIN: no jaundice, no rashes  Imaging/Labs: as above  I personally reviewed and interpreted the available labs, imaging and endoscopic files.  Impression and Plan: Theresa Sullivan is a 54 y.o. female with PMH diabetes mellitus, who presents for evaluation of constipation.  The patient has presented recent onset of constipation which has improved with intake of  fiber supplementation recently.  Overall, her symptoms are better controlled at the moment.  She has been taking Zofran which can lead to constipation, but she has felt improvement of her nausea with it so she will need to continue it.  At this moment, she should continue taking the Benefiber as she is doing right now, but she was counseled to implement polyethylene glycol on a daily basis if she has recurrent constipation.  We will check for other causes of constipation with TSH testing and electrolytes, however this seems to be related to her Zofran intake.  Finally, the patient is due for colorectal cancer screening, she is at average risk for colorectal cancer, will schedule colonoscopy.  - Schedule colonoscopy - Check TSH, CBC and CMP - Continue Benefiber fiber supplements daily to increase stool bulk - Take prunes and eat kiwi daily - Start taking Purelax or Miralax 1 cap every day for one week if you have to strain to have BMs. If bowel movements do not improve, increase to 1 cup every 12 hours. If after two weeks there is no improvement, increase to 1 cup every 8 hours  All questions were answered.      Maylon Peppers, MD Gastroenterology and Hepatology [facility]

## 2020-04-29 NOTE — Patient Instructions (Signed)
Schedule colonoscopy Continue Benefiber fiber supplements daily to increase stool bulk Take prunes and eat kiwi daily Start taking Purelax or Miralax 1 cap every day for one week if you have to strain to have BMs. If bowel movements do not improve, increase to 1 cup every 12 hours. If after two weeks there is no improvement, increase to 1 cup every 8 hours

## 2020-05-01 ENCOUNTER — Other Ambulatory Visit (INDEPENDENT_AMBULATORY_CARE_PROVIDER_SITE_OTHER): Payer: Self-pay | Admitting: *Deleted

## 2020-05-01 ENCOUNTER — Encounter (INDEPENDENT_AMBULATORY_CARE_PROVIDER_SITE_OTHER): Payer: Self-pay

## 2020-05-01 ENCOUNTER — Telehealth (INDEPENDENT_AMBULATORY_CARE_PROVIDER_SITE_OTHER): Payer: Self-pay

## 2020-05-01 ENCOUNTER — Other Ambulatory Visit (INDEPENDENT_AMBULATORY_CARE_PROVIDER_SITE_OTHER): Payer: Self-pay

## 2020-05-01 DIAGNOSIS — Z1212 Encounter for screening for malignant neoplasm of rectum: Secondary | ICD-10-CM

## 2020-05-01 DIAGNOSIS — Z1211 Encounter for screening for malignant neoplasm of colon: Secondary | ICD-10-CM

## 2020-05-01 MED ORDER — PLENVU 140 G PO SOLR: 1.0000 | Freq: Once | ORAL | 0 refills | Status: AC

## 2020-05-01 NOTE — Telephone Encounter (Signed)
Patient has Plenvu (copay card) 

## 2020-05-22 ENCOUNTER — Other Ambulatory Visit (INDEPENDENT_AMBULATORY_CARE_PROVIDER_SITE_OTHER): Payer: Self-pay

## 2020-05-27 ENCOUNTER — Other Ambulatory Visit: Payer: Self-pay

## 2020-05-27 ENCOUNTER — Other Ambulatory Visit (HOSPITAL_COMMUNITY)
Admission: RE | Admit: 2020-05-27 | Discharge: 2020-05-27 | Disposition: A | Discharge status: Home / Self Care | Payer: 59 | Source: Ambulatory Visit | Attending: Gastroenterology | Admitting: Gastroenterology

## 2020-05-27 DIAGNOSIS — Z01812 Encounter for preprocedural laboratory examination: Secondary | ICD-10-CM | POA: Insufficient documentation

## 2020-05-27 DIAGNOSIS — Z20822 Contact with and (suspected) exposure to covid-19: Secondary | ICD-10-CM | POA: Insufficient documentation

## 2020-05-27 LAB — BASIC METABOLIC PANEL
Anion gap: 11 (ref 5–15)
BUN: 9 mg/dL (ref 6–20)
CO2: 28 mmol/L (ref 22–32)
Calcium: 9.5 mg/dL (ref 8.9–10.3)
Chloride: 98 mmol/L (ref 98–111)
Creatinine, Ser: 1.14 mg/dL — ABNORMAL HIGH (ref 0.44–1.00)
GFR, Estimated: 57 mL/min — ABNORMAL LOW (ref >60)
Glucose, Bld: 174 mg/dL — ABNORMAL HIGH (ref 70–99)
Potassium: 4.2 mmol/L (ref 3.5–5.1)
Sodium: 137 mmol/L (ref 135–145)

## 2020-05-27 LAB — SARS CORONAVIRUS 2 (TAT 6-24 HRS): SARS Coronavirus 2: NEGATIVE

## 2020-05-28 ENCOUNTER — Ambulatory Visit (HOSPITAL_COMMUNITY)
Admission: RE | Admit: 2020-05-28 | Discharge: 2020-05-28 | Disposition: A | Discharge status: Home / Self Care | Payer: 59 | Attending: Gastroenterology | Admitting: Gastroenterology

## 2020-05-28 ENCOUNTER — Ambulatory Visit (HOSPITAL_COMMUNITY): Payer: 59 | Admitting: Anesthesiology

## 2020-05-28 ENCOUNTER — Other Ambulatory Visit: Payer: Self-pay

## 2020-05-28 ENCOUNTER — Encounter (HOSPITAL_COMMUNITY): Payer: Self-pay | Admitting: Gastroenterology

## 2020-05-28 ENCOUNTER — Encounter (HOSPITAL_COMMUNITY)
Admission: RE | Disposition: A | Discharge status: Home / Self Care | Payer: Self-pay | Source: Home / Self Care | Attending: Gastroenterology

## 2020-05-28 DIAGNOSIS — Z79899 Other long term (current) drug therapy: Secondary | ICD-10-CM | POA: Diagnosis not present

## 2020-05-28 DIAGNOSIS — K648 Other hemorrhoids: Secondary | ICD-10-CM | POA: Diagnosis not present

## 2020-05-28 DIAGNOSIS — E119 Type 2 diabetes mellitus without complications: Secondary | ICD-10-CM | POA: Insufficient documentation

## 2020-05-28 DIAGNOSIS — Z91013 Allergy to seafood: Secondary | ICD-10-CM | POA: Insufficient documentation

## 2020-05-28 DIAGNOSIS — Z1211 Encounter for screening for malignant neoplasm of colon: Secondary | ICD-10-CM | POA: Diagnosis not present

## 2020-05-28 HISTORY — PX: COLONOSCOPY WITH PROPOFOL: SHX5780

## 2020-05-28 LAB — HM COLONOSCOPY

## 2020-05-28 LAB — GLUCOSE, CAPILLARY: Glucose-Capillary: 176 mg/dL — ABNORMAL HIGH (ref 70–99)

## 2020-05-28 SURGERY — COLONOSCOPY WITH PROPOFOL
Anesthesia: General

## 2020-05-28 MED ORDER — PROPOFOL 500 MG/50ML IV EMUL
INTRAVENOUS | Status: DC | PRN
  Administered 2020-05-28: 150 ug/kg/min via INTRAVENOUS

## 2020-05-28 MED ORDER — PROPOFOL 10 MG/ML IV BOLUS
INTRAVENOUS | Status: DC | PRN
  Administered 2020-05-28: 100 mg via INTRAVENOUS

## 2020-05-28 MED ORDER — LACTATED RINGERS IV SOLN: Freq: Once | INTRAVENOUS | Status: AC

## 2020-05-28 MED ORDER — STERILE WATER FOR IRRIGATION IR SOLN
Status: DC | PRN
  Administered 2020-05-28: 100 mL

## 2020-05-28 MED ORDER — LACTATED RINGERS IV SOLN: INTRAVENOUS | Status: DC | PRN

## 2020-05-28 NOTE — Anesthesia Postprocedure Evaluation (Signed)
Anesthesia Post Note  Patient: Theresa Sullivan  Procedure(s) Performed: COLONOSCOPY WITH PROPOFOL (N/A )  Patient location during evaluation: Phase II Anesthesia Type: General Level of consciousness: awake, oriented, awake and alert and patient cooperative Pain management: satisfactory to patient Vital Signs Assessment: post-procedure vital signs reviewed and stable Respiratory status: spontaneous breathing, respiratory function stable and nonlabored ventilation Cardiovascular status: stable Postop Assessment: no apparent nausea or vomiting Anesthetic complications: no   No complications documented.   Last Vitals:  Vitals:   05/28/20 1004  BP: (!) 182/93  Pulse: (!) 106  Resp: 14  Temp: 36.4 C  SpO2: 98%    Last Pain:  Vitals:   05/28/20 1054  TempSrc:   PainSc: 0-No pain                 Sharrod Achille

## 2020-05-28 NOTE — Discharge Instructions (Signed)
You are being discharged to home.  Resume your previous diet.  Your physician has recommended a repeat  colonoscopy in 10 years for screening purposes.     Hemorrhoids Hemorrhoids are swollen veins that may develop:  In the butt (rectum). These are called internal hemorrhoids.  Around the opening of the butt (anus). These are called external hemorrhoids. Hemorrhoids can cause pain, itching, or bleeding. Most of the time, they do not cause serious problems. They usually get better with diet changes, lifestyle changes, and other home treatments. What are the causes? This condition may be caused by:  Having trouble pooping (constipation).  Pushing hard (straining) to poop.  Watery poop (diarrhea).  Pregnancy.  Being very overweight (obese).  Sitting for long periods of time.  Heavy lifting or other activity that causes you to strain.  Anal sex.  Riding a bike for a long period of time. What are the signs or symptoms? Symptoms of this condition include:  Pain.  Itching or soreness in the butt.  Bleeding from the butt.  Leaking poop.  Swelling in the area.  One or more lumps around the opening of your butt. How is this diagnosed? A doctor can often diagnose this condition by looking at the affected area. The doctor may also:  Do an exam that involves feeling the area with a gloved hand (digital rectal exam).  Examine the area inside your butt using a small tube (anoscope).  Order blood tests. This may be done if you have lost a lot of blood.  Have you get a test that involves looking inside the colon using a flexible tube with a camera on the end (sigmoidoscopy or colonoscopy). How is this treated? This condition can usually be treated at home. Your doctor may tell you to change what you eat, make lifestyle changes, or try home treatments. If these do not help, procedures can be done to remove the hemorrhoids or make them smaller. These may involve:  Placing  rubber bands at the base of the hemorrhoids to cut off their blood supply.  Injecting medicine into the hemorrhoids to shrink them.  Shining a type of light energy onto the hemorrhoids to cause them to fall off.  Doing surgery to remove the hemorrhoids or cut off their blood supply. Follow these instructions at home: Eating and drinking   Eat foods that have a lot of fiber in them. These include whole grains, beans, nuts, fruits, and vegetables.  Ask your doctor about taking products that have added fiber (fibersupplements).  Reduce the amount of fat in your diet. You can do this by: ? Eating low-fat dairy products. ? Eating less red meat. ? Avoiding processed foods.  Drink enough fluid to keep your pee (urine) pale yellow. Managing pain and swelling   Take a warm-water bath (sitz bath) for 20 minutes to ease pain. Do this 3-4 times a day. You may do this in a bathtub or using a portable sitz bath that fits over the toilet.  If told, put ice on the painful area. It may be helpful to use ice between your warm baths. ? Put ice in a plastic bag. ? Place a towel between your skin and the bag. ? Leave the ice on for 20 minutes, 2-3 times a day. General instructions  Take over-the-counter and prescription medicines only as told by your doctor. ? Medicated creams and medicines may be used as told.  Exercise often. Ask your doctor how much and what kind of exercise  is best for you.  Go to the bathroom when you have the urge to poop. Do not wait.  Avoid pushing too hard when you poop.  Keep your butt dry and clean. Use wet toilet paper or moist towelettes after pooping.  Do not sit on the toilet for a long time.  Keep all follow-up visits as told by your doctor. This is important. Contact a doctor if you:  Have pain and swelling that do not get better with treatment or medicine.  Have trouble pooping.  Cannot poop.  Have pain or swelling outside the area of the  hemorrhoids. Get help right away if you have:  Bleeding that will not stop. Summary  Hemorrhoids are swollen veins in the butt or around the opening of the butt.  They can cause pain, itching, or bleeding.  Eat foods that have a lot of fiber in them. These include whole grains, beans, nuts, fruits, and vegetables.  Take a warm-water bath (sitz bath) for 20 minutes to ease pain. Do this 3-4 times a day. This information is not intended to replace advice given to you by your health care provider. Make sure you discuss any questions you have with your health care provider. Document Revised: 07/14/2018 Document Reviewed: 11/25/2017 Elsevier Patient Education  2020 [employer].  Colonoscopy, Adult, Care After This sheet gives you information about how to care for yourself after your procedure. Your doctor may also give you more specific instructions. If you have problems or questions, call your doctor. What can I expect after the procedure? After the procedure, it is common to have:  A small amount of blood in your poop (stool) for 24 hours.  Some gas.  Mild cramping or bloating in your belly (abdomen). Follow these instructions at home: Eating and drinking   Drink enough fluid to keep your pee (urine) pale yellow.  Follow instructions from your doctor about what you cannot eat or drink.  Return to your normal diet as told by your doctor. Avoid heavy or fried foods that are hard to digest. Activity  Rest as told by your doctor.  Do not sit for a long time without moving. Get up to take short walks every 1-2 hours. This is important. Ask for help if you feel weak or unsteady.  Return to your normal activities as told by your doctor. Ask your doctor what activities are safe for you. To help cramping and bloating:   Try walking around.  Put heat on your belly as told by your doctor. Use the heat source that your doctor recommends, such as a moist heat pack or a heating  pad. ? Put a towel between your skin and the heat source. ? Leave the heat on for 20-30 minutes. ? Remove the heat if your skin turns bright red. This is very important if you are unable to feel pain, heat, or cold. You may have a greater risk of getting burned. General instructions  For the first 24 hours after the procedure: ? Do not drive or use machinery. ? Do not sign important documents. ? Do not drink alcohol. ? Do your daily activities more slowly than normal. ? Eat foods that are soft and easy to digest.  Take over-the-counter or prescription medicines only as told by your doctor.  Keep all follow-up visits as told by your doctor. This is important. Contact a doctor if:  You have blood in your poop 2-3 days after the procedure. Get help right away if:  You have more than a small amount of blood in your poop.  You see large clumps of tissue (blood clots) in your poop.  Your belly is swollen.  You feel like you may vomit (nauseous).  You vomit.  You have a fever.  You have belly pain that gets worse, and medicine does not help your pain. Summary  After the procedure, it is common to have a small amount of blood in your poop. You may also have mild cramping and bloating in your belly.  For the first 24 hours after the procedure, do not drive or use machinery, do not sign important documents, and do not drink alcohol.  Get help right away if you have a lot of blood in your poop, feel like you may vomit, have a fever, or have more belly pain. This information is not intended to replace advice given to you by your health care provider. Make sure you discuss any questions you have with your health care provider. Document Revised: 01/30/2019 Document Reviewed: 01/30/2019 Elsevier Patient Education  [place].

## 2020-05-28 NOTE — Op Note (Signed)
[facility] Patient Name: Theresa Sullivan Procedure Date: 05/28/2020 10:39 AM MRN: [id number] Date of Birth: [birth year] Attending MD: Katrinka Blazing ,  CSN: [id number] Age: 54 Admit Type: Outpatient Procedure:                Colonoscopy Indications:              Screening for colorectal malignant neoplasm Providers:                Katrinka Blazing, Sheran Fava Dyann Ruddle Referring MD:              Medicines:                Monitored Anesthesia Care Complications:            No immediate complications. Estimated Blood Loss:     Estimated blood loss: none. Procedure:                Pre-Anesthesia Assessment:                           - Prior to the procedure, a History and Physical                            was performed, and patient medications, allergies                            and sensitivities were reviewed. The patient's                            tolerance of previous anesthesia was reviewed.                           - The risks and benefits of the procedure and the                            sedation options and risks were discussed with the                            patient. All questions were answered and informed                            consent was obtained.                           - ASA Grade Assessment: II - A patient with mild                            systemic disease.                           After obtaining informed consent, the colonoscope                            was passed under direct vision. Throughout the                            procedure, the patient's blood pressure, pulse,  and                            oxygen saturations were monitored continuously. The                            [id number]-HQ190L ([id number]) scope was introduced through                            the anus and advanced to the the cecum, identified                            by appendiceal orifice and ileocecal valve. The                            colonoscopy was  performed without difficulty. The                            patient tolerated the procedure well. The quality                            of the bowel preparation was excellent. Scope                            withdrawal time was 10 minutes. Scope In: 10:59:10 AM Scope Out: 11:15:38 AM Scope Withdrawal Time: 0 hours 10 minutes 21 seconds  Total Procedure Duration: 0 hours 16 minutes 28 seconds  Findings:      Hemorrhoids were found on perianal exam.      The colon (entire examined portion) appeared normal.      Non-bleeding internal hemorrhoids were found during retroflexion. The       hemorrhoids were moderate. Impression:               - Hemorrhoids found on perianal exam.                           - The entire examined colon is normal.                           - Non-bleeding internal hemorrhoids.                           - No specimens collected. Moderate Sedation:      Per Anesthesia Care Recommendation:           - Discharge patient to home (ambulatory).                           - Resume previous diet.                           - Repeat colonoscopy in 10 years for screening                            purposes. Procedure Code(s):        --- Professional ---  G0121, GC, Colorectal cancer screening; colonoscopy                            on individual not meeting criteria for high risk Diagnosis Code(s):        --- Professional ---                           Z12.11, Encounter for screening for malignant                            neoplasm of colon                           K64.8, Other hemorrhoids CPT copyright 2019 American Medical Association. All rights reserved. The codes documented in this report are preliminary and upon coder review may  be revised to meet current compliance requirements. Katrinka Blazing, MD Katrinka Blazing,  05/28/2020 11:19:36 AM This report has been signed electronically. Number of Addenda: 0

## 2020-05-28 NOTE — Transfer of Care (Signed)
Immediate Anesthesia Transfer of Care Note  Patient: Theresa Sullivan  Procedure(s) Performed: COLONOSCOPY WITH PROPOFOL (N/A )  Patient Location: PACU  Anesthesia Type:General  Level of Consciousness: awake, alert , oriented and patient cooperative  Airway & Oxygen Therapy: Patient Spontanous Breathing  Post-op Assessment: Report given to RN, Post -op Vital signs reviewed and stable and Patient moving all extremities X 4  Post vital signs: Reviewed and stable  Last Vitals:  Vitals Value Taken Time  BP    Temp    Pulse    Resp    SpO2      Last Pain:  Vitals:   05/28/20 1054  TempSrc:   PainSc: 0-No pain      Patients Stated Pain Goal: 8 (05/28/20 1004)  Complications: No complications documented.

## 2020-05-28 NOTE — H&P (Signed)
Theresa Sullivan is an 54 y.o. female.   Chief Complaint: screening colonoscopy HPI: 54 y.o. female with PMH diabetes mellitus and constipation, who comes to the hospital to undergo screening colonoscopy.  The patient has never had a colonoscopy in the past.  She denies having any complaints such as melena, hematochezia, abdominal pain or distention, change in her bowel movement consistency or frequency, no changes in her weight recently.  No family history of colorectal cancer.   Past Medical History:  Diagnosis Date  . Diabetes mellitus without complication [facility])     Past Surgical History:  Procedure Laterality Date  . ABLATION    . CHOLECYSTECTOMY      Family History  Problem Relation Age of Onset  . Asthma Mother   . Diabetes Father   . Allergic rhinitis Neg Hx   . Angioedema Neg Hx   . Eczema Neg Hx   . Immunodeficiency Neg Hx   . Urticaria Neg Hx    Social History:  reports that she has never smoked. She has never used smokeless tobacco. She reports that she does not drink alcohol and does not use drugs.  Allergies:  Allergies  Allergen Reactions  . Shellfish Allergy Swelling    Mostly trout and oysters    Medications Prior to Admission  Medication Sig Dispense Refill  . Cholecalciferol (VITAMIN D3) 250 MCG (10000 UT) capsule Take 10,000 Units by mouth daily.    . citalopram (CELEXA) 20 MG tablet Take 20 mg by mouth daily.    . cyclobenzaprine (FLEXERIL) 10 MG tablet Take 10 mg by mouth See admin instructions. Take 10 mg daily, may take a second 10 mg dose as needed for muscle spasms    . diazepam (VALIUM) 10 MG tablet Take 10 mg by mouth daily as needed for anxiety.     . docusate sodium (COLACE) 250 MG capsule Take 250 mg by mouth at bedtime.    . Dulaglutide (TRULICITY) 3 MG/0.5ML SOPN Inject 3 mg into the skin once a week. Sat or Sun    . lisinopril (ZESTRIL) 2.5 MG tablet Take 2.5 mg by mouth daily.    . ondansetron (ZOFRAN) 4 MG tablet Take 4 mg by mouth every  8 (eight) hours as needed for nausea or vomiting.    Marland Kitchen OVER THE COUNTER MEDICATION Take 1 tablet by mouth 2 (two) times daily. [employer] Her[employer] Plus Energy otc supplement    . Wheat Dextrin (BENEFIBER PO) Take 2 Scoops by mouth 2 (two) times daily.     . diphenhydrAMINE (BENADRYL) 25 MG tablet Take 1 tablet (25 mg total) by mouth 3 (three) times daily. Take one tablet three times daily for two days (Patient taking differently: Take 25 mg by mouth daily as needed for itching. ) 10 tablet 0    Results for orders placed or performed during the hospital encounter of 05/27/20 (from the past 48 hour(s))  SARS CORONAVIRUS 2 (TAT 6-24 HRS) Nasopharyngeal Nasopharyngeal Swab     Status: None   Collection Time: 05/27/20  8:50 AM   Specimen: Nasopharyngeal Swab  Result Value Ref Range   SARS Coronavirus 2 NEGATIVE NEGATIVE    Comment: (NOTE) SARS-CoV-2 target nucleic acids are NOT DETECTED.  The SARS-CoV-2 RNA is generally detectable in upper and lower respiratory specimens during the acute phase of infection. Negative results do not preclude SARS-CoV-2 infection, do not rule out co-infections with other pathogens, and should not be used as the sole basis for treatment or other patient management  decisions. Negative results must be combined with clinical observations, patient history, and epidemiological information. The expected result is Negative.  Fact Sheet for Patients: HairSlick.no  Fact Sheet for Healthcare Providers: quierodirigir.com  This test is not yet approved or cleared by the [place] FDA and  has been authorized for detection and/or diagnosis of SARS-CoV-2 by FDA under an Emergency Use Authorization (EUA). This EUA will remain  in effect (meaning this test can be used) for the duration of the COVID-19 declaration under Se ction 564(b)(1) of the Act, 21 U.S.C. section 360bbb-3(b)(1), unless the authorization is terminated  or revoked sooner.  Performed at [facility], [address] [place], [place] [address]   Basic metabolic panel     Status: Abnormal   Collection Time: 05/27/20  8:53 AM  Result Value Ref Range   Sodium 137 135 - 145 mmol/L   Potassium 4.2 3.5 - 5.1 mmol/L   Chloride 98 98 - 111 mmol/L   CO2 28 22 - 32 mmol/L   Glucose, Bld 174 (H) 70 - 99 mg/dL    Comment: Glucose reference range applies only to samples taken after fasting for at least 8 hours.   BUN 9 6 - 20 mg/dL   Creatinine, Ser 3.71 (H) 0.44 - 1.00 mg/dL   Calcium 9.5 8.9 - 69.6 mg/dL   GFR, Estimated 57 (L) >60 mL/min    Comment: (NOTE) Calculated using the CKD-EPI Creatinine Equation (2021)    Anion gap 11 5 - 15    Comment: Performed at [facility], [address], [place], [place] [address]   No results found.  Review of Systems  Constitutional: Negative.   HENT: Negative.   Eyes: Negative.   Respiratory: Negative.   Cardiovascular: Negative.   Gastrointestinal: Negative.   Endocrine: Negative.   Genitourinary: Negative.   Musculoskeletal: Negative.   Skin: Negative.   Allergic/Immunologic: Negative.   Neurological: Negative.   Hematological: Negative.   Psychiatric/Behavioral: Negative.     Blood pressure (!) 182/93, pulse (!) 106, temperature 97.6 F (36.4 C), temperature source Oral, resp. rate 14, height 5\' 5"  (1.651 m), weight 88.5 kg, SpO2 98 %. Physical Exam  GENERAL: The patient is AO x3, in no acute distress. HEENT: Head is normocephalic and atraumatic. EOMI are intact. Mouth is well hydrated and without lesions. NECK: Supple. No masses LUNGS: Clear to auscultation. No presence of rhonchi/wheezing/rales. Adequate chest expansion HEART: RRR, normal s1 and s2. ABDOMEN: Soft, nontender, no guarding, no peritoneal signs, and nondistended. BS +. No masses. EXTREMITIES: Without any cyanosis, clubbing, rash, lesions or edema. NEUROLOGIC: AOx3, no focal motor deficit. SKIN: no jaundice, no  rashes  Assessment/Plan  54 y.o. female with PMH diabetes mellitus and constipation, who comes to the hospital to undergo screening colonoscopy.  , The patient is at average risk for colorectal cancer.  We will proceed with colonoscopy today.   57, MD 05/28/2020, 10:11 AM

## 2020-05-28 NOTE — Anesthesia Preprocedure Evaluation (Signed)
Anesthesia Evaluation  Patient identified by MRN, date of birth, ID band Patient awake    Reviewed: Allergy & Precautions, NPO status , Patient's Chart, lab work & pertinent test results  History of Anesthesia Complications Negative for: history of anesthetic complications  Airway        Dental  (+) Dental Advisory Given   Pulmonary neg pulmonary ROS,    Pulmonary exam normal breath sounds clear to auscultation       Cardiovascular Exercise Tolerance: Good Normal cardiovascular exam Rhythm:Regular Rate:Normal     Neuro/Psych negative neurological ROS  negative psych ROS   GI/Hepatic negative GI ROS, Neg liver ROS,   Endo/Other  diabetes, Well Controlled, Type 2  Renal/GU negative Renal ROS     Musculoskeletal negative musculoskeletal ROS (+)   Abdominal   Peds  Hematology negative hematology ROS (+)   Anesthesia Other Findings   Reproductive/Obstetrics negative OB ROS                             Anesthesia Physical Anesthesia Plan  ASA: II  Anesthesia Plan: General   Post-op Pain Management:    Induction: Intravenous  PONV Risk Score and Plan: TIVA  Airway Management Planned: Nasal Cannula, Natural Airway and Simple Face Mask  Additional Equipment:   Intra-op Plan:   Post-operative Plan:   Informed Consent: I have reviewed the patients History and Physical, chart, labs and discussed the procedure including the risks, benefits and alternatives for the proposed anesthesia with the patient or authorized representative who has indicated his/her understanding and acceptance.     Dental advisory given  Plan Discussed with: CRNA and Surgeon  Anesthesia Plan Comments:         Anesthesia Quick Evaluation

## 2020-06-03 ENCOUNTER — Encounter (HOSPITAL_COMMUNITY): Payer: Self-pay | Admitting: Gastroenterology

## 2020-06-08 ENCOUNTER — Encounter (HOSPITAL_COMMUNITY): Payer: Self-pay | Admitting: Emergency Medicine

## 2020-06-08 ENCOUNTER — Emergency Department (HOSPITAL_COMMUNITY)
Admission: EM | Admit: 2020-06-08 | Discharge: 2020-06-08 | Disposition: A | Discharge status: Home / Self Care | Payer: 59 | Attending: Emergency Medicine | Admitting: Emergency Medicine

## 2020-06-08 ENCOUNTER — Other Ambulatory Visit: Payer: Self-pay

## 2020-06-08 DIAGNOSIS — E119 Type 2 diabetes mellitus without complications: Secondary | ICD-10-CM | POA: Insufficient documentation

## 2020-06-08 DIAGNOSIS — Z794 Long term (current) use of insulin: Secondary | ICD-10-CM | POA: Insufficient documentation

## 2020-06-08 DIAGNOSIS — M5441 Lumbago with sciatica, right side: Secondary | ICD-10-CM | POA: Diagnosis not present

## 2020-06-08 DIAGNOSIS — M545 Low back pain, unspecified: Secondary | ICD-10-CM | POA: Diagnosis present

## 2020-06-08 MED ORDER — KETOROLAC TROMETHAMINE 30 MG/ML IJ SOLN
30.0000 mg | Freq: Once | INTRAMUSCULAR | Status: AC
  Administered 2020-06-08: 30 mg via INTRAVENOUS
  Filled 2020-06-08: qty 1

## 2020-06-08 MED ORDER — LIDOCAINE 5 % EX PTCH: 1.0000 | MEDICATED_PATCH | CUTANEOUS | 0 refills | Status: DC

## 2020-06-08 MED ORDER — PREDNISONE 10 MG PO TABS: 10.0000 mg | ORAL_TABLET | Freq: Every day | ORAL | 0 refills | Status: AC

## 2020-06-08 MED ORDER — NAPROXEN 500 MG PO TABS: 500.0000 mg | ORAL_TABLET | Freq: Two times a day (BID) | ORAL | 0 refills | Status: AC

## 2020-06-08 NOTE — ED Triage Notes (Signed)
Pt c/o of back pain with sciatica that began 2 days ago.

## 2020-06-08 NOTE — Discharge Instructions (Signed)
Take naproxen, prednisone and use lidoderm patches as directed. You will need to monitor your blood sugars closely as the prednisone can increase your blood sugars.   Please follow up with your primary care provider within 5-7 days for re-evaluation of your symptoms.  Return to the emergency department immediately if you experience any back pain associated with fevers, loss of control of your bowels/bladder, weakness/numbness to your legs, numbness to your groin area, inability to walk, or inability to urinate.

## 2020-06-08 NOTE — ED Provider Notes (Signed)
[facility] EMERGENCY DEPARTMENT Provider Note   CSN: [id number] Arrival date & time: 06/08/20  1401     History Chief Complaint  Patient presents with  . Back Pain    Theresa Sullivan is a 54 y.o. female.  HPI   54 year old female with a history of diabetes who presents the emergency department today for evaluation of back pain. States that she has a history of sciatica and it flares up every now and then. She is got massages, but no chiropractor and did physical therapy in the past. Most recent episode started about 2 days ago. Pain is located to the right lower back. Pain radiates down the right lower extremity. She describes some paresthesias to the right lower extremity but denies any numbness or weakness. Denies loss control of bowel or bladder function, history of cancer or IV drug use or fevers. She is on Valium and Flexeril at home which has not improved her symptoms. She denies any recent falls or trauma.  Past Medical History:  Diagnosis Date  . Diabetes mellitus without complication [facility])     Patient Active Problem List   Diagnosis Date Noted  . Constipation 04/29/2020  . Encounter for colorectal cancer screening 04/29/2020    Past Surgical History:  Procedure Laterality Date  . ABLATION    . CHOLECYSTECTOMY    . COLONOSCOPY WITH PROPOFOL N/A 05/28/2020   Procedure: COLONOSCOPY WITH PROPOFOL;  Surgeon: Dolores Frame, MD;  Location: [facility];  Service: Gastroenterology;  Laterality: N/A;  11:15     OB History   No obstetric history on file.     Family History  Problem Relation Age of Onset  . Asthma Mother   . Diabetes Father   . Allergic rhinitis Neg Hx   . Angioedema Neg Hx   . Eczema Neg Hx   . Immunodeficiency Neg Hx   . Urticaria Neg Hx     Social History   Tobacco Use  . Smoking status: Never Smoker  . Smokeless tobacco: Never Used  Vaping Use  . Vaping Use: Never used  Substance Use Topics  . Alcohol use: No  . Drug  use: No    Home Medications Prior to Admission medications   Medication Sig Start Date End Date Taking? Authorizing Provider  Cholecalciferol (VITAMIN D3) 250 MCG (10000 UT) capsule Take 10,000 Units by mouth daily.    [provider]  citalopram (CELEXA) 20 MG tablet Take 20 mg by mouth daily.    [provider]  cyclobenzaprine (FLEXERIL) 10 MG tablet Take 10 mg by mouth See admin instructions. Take 10 mg daily, may take a second 10 mg dose as needed for muscle spasms    [provider]  diazepam (VALIUM) 10 MG tablet Take 10 mg by mouth daily as needed for anxiety.     [provider]  diphenhydrAMINE (BENADRYL) 25 MG tablet Take 1 tablet (25 mg total) by mouth 3 (three) times daily. Take one tablet three times daily for two days Patient taking differently: Take 25 mg by mouth daily as needed for itching.  08/28/17   Gerhard Munch, MD  docusate sodium (COLACE) 250 MG capsule Take 250 mg by mouth at bedtime.    [provider]  Dulaglutide (TRULICITY) 3 MG/0.5ML SOPN Inject 3 mg into the skin once a week. Sat or Sun    [provider]  lidocaine (LIDODERM) 5 % Place 1 patch onto the skin daily. Remove & Discard patch within 12  hours or as directed by MD 06/08/20   Gurman Ashland S, PA-C  lisinopril (ZESTRIL) 2.5 MG tablet Take 2.5 mg by mouth daily.    [provider]  naproxen (NAPROSYN) 500 MG tablet Take 1 tablet (500 mg total) by mouth 2 (two) times daily for 7 days. 06/08/20 06/15/20  Katianne Barre S, PA-C  ondansetron (ZOFRAN) 4 MG tablet Take 4 mg by mouth every 8 (eight) hours as needed for nausea or vomiting.    [provider]  OVER THE COUNTER MEDICATION Take 1 tablet by mouth 2 (two) times daily. [employer] Her[employer] Plus Energy otc supplement    [provider]  predniSONE (DELTASONE) 10 MG tablet Take 1 tablet (10 mg total) by mouth daily for 5 days. 06/08/20 06/13/20  Doha Boling S, PA-C  Wheat Dextrin  (BENEFIBER PO) Take 2 Scoops by mouth 2 (two) times daily.     [provider]    Allergies    Shellfish allergy  Review of Systems   Review of Systems  Constitutional: Negative for fever.  Respiratory: Negative for shortness of breath.   Cardiovascular: Negative for chest pain.  Gastrointestinal: Negative for abdominal pain, blood in stool, constipation, diarrhea, nausea and vomiting.  Genitourinary: Negative for dysuria, flank pain, frequency, hematuria and urgency.  Musculoskeletal: Positive for back pain. Negative for gait problem.  Skin: Negative for wound.  Neurological: Negative for weakness and numbness.    Physical Exam Updated Vital Signs BP (!) 153/80 (BP Location: Right Arm)   Pulse 89   Temp (!) 97.5 F (36.4 C) (Oral)   Resp 16   Ht 5\' 5"  (1.651 m)   Wt 87.5 kg   SpO2 100%   BMI 32.12 kg/m   Physical Exam Vitals and nursing note reviewed.  Constitutional:      General: She is not in acute distress.    Appearance: She is well-developed.  HENT:     Head: Normocephalic and atraumatic.  Eyes:     Conjunctiva/sclera: Conjunctivae normal.  Cardiovascular:     Rate and Rhythm: Normal rate.  Pulmonary:     Effort: Pulmonary effort is normal.  Musculoskeletal:        General: Normal range of motion.     Cervical back: Neck supple.     Comments: TTP to the midline lumbar spine and to the bilat lumbar paraspinous muscles. TTP noted to the sciatic notch and just superior to this. Strength 5/5 to the BLE and noted to be symmetric throughout. Sensation intact bilaterally.   Skin:    General: Skin is warm and dry.  Neurological:     Mental Status: She is alert.     ED Results / Procedures / Treatments   Labs (all labs ordered are listed, but only abnormal results are displayed) Labs Reviewed - No data to display  EKG None  Radiology No results found.  Procedures Procedures (including critical care time)  Medications Ordered in  ED Medications  ketorolac (TORADOL) 30 MG/ML injection 30 mg (has no administration in time range)    ED Course  I have reviewed the triage vital signs and the nursing notes.  Pertinent labs & imaging results that were available during my care of the patient were reviewed by me and considered in my medical decision making (see chart for details).    MDM Rules/Calculators/A&P                          Patient  with back pain. H/o sciatica and sxs feel similar today. No neurological deficits and normal neuro exam.  Patient has been ambulatory at home.  No loss of bowel or bladder control.  No concern for cauda equina.  No fever, night sweats, weight loss, h/o cancer, IVDU.  RICE protocol and pain medicine indicated and discussed with patient.   Final Clinical Impression(s) / ED Diagnoses Final diagnoses:  Acute right-sided low back pain with right-sided sciatica    Rx / DC Orders ED Discharge Orders         Ordered    naproxen (NAPROSYN) 500 MG tablet  2 times daily        06/08/20 1428    predniSONE (DELTASONE) 10 MG tablet  Daily        06/08/20 1428    lidocaine (LIDODERM) 5 %  Every 24 hours        06/08/20 1429           Othniel Maret S, PA-C 06/08/20 1429    Eber Hong, MD 06/09/20 [phone]

## 2020-06-08 NOTE — ED Notes (Signed)
Pt has been discharged  Registration is now in room

## 2020-06-12 ENCOUNTER — Encounter (INDEPENDENT_AMBULATORY_CARE_PROVIDER_SITE_OTHER): Payer: Self-pay | Admitting: *Deleted

## 2021-10-31 ENCOUNTER — Ambulatory Visit (INDEPENDENT_AMBULATORY_CARE_PROVIDER_SITE_OTHER): Payer: 59 | Admitting: Obstetrics & Gynecology

## 2021-10-31 ENCOUNTER — Encounter: Payer: Self-pay | Admitting: Obstetrics & Gynecology

## 2021-10-31 ENCOUNTER — Other Ambulatory Visit (HOSPITAL_COMMUNITY)
Admission: RE | Admit: 2021-10-31 | Discharge: 2021-10-31 | Disposition: A | Discharge status: Home / Self Care | Payer: 59 | Source: Ambulatory Visit | Attending: Obstetrics & Gynecology | Admitting: Obstetrics & Gynecology

## 2021-10-31 VITALS — BP 144/86 | HR 92 | Ht 65.0 in | Wt 212.0 lb

## 2021-10-31 DIAGNOSIS — Z124 Encounter for screening for malignant neoplasm of cervix: Secondary | ICD-10-CM

## 2021-10-31 DIAGNOSIS — Z01419 Encounter for gynecological examination (general) (routine) without abnormal findings: Secondary | ICD-10-CM | POA: Diagnosis present

## 2021-10-31 NOTE — Addendum Note (Signed)
Addended by: Moss Mc on: 10/31/2021 11:18 AM ? ? Modules accepted: Orders ? ?

## 2021-10-31 NOTE — Progress Notes (Signed)
Subjective:  ?  ? Theresa Sullivan is a 56 y.o. female here for a routine exam.  No LMP recorded. Patient has had an ablation. No obstetric history on file. ?Birth Control Method:  no periods post ablation ?Menstrual Calendar(currently):  none ?Current complaints: none.  ? ?Current acute medical issues:  none ?  ?Recent Gynecologic History ?No LMP recorded. Patient has had an ablation. ?Last Pap: 2013,  normal ?Last mammogram: ,  2022 normal, next week scheduled ? ?Past Medical History:  ?Diagnosis Date  ? Anxiety   ? Diabetes mellitus without complication (HCC)   ? Hypertension   ? Insomnia   ? ? ?Past Surgical History:  ?Procedure Laterality Date  ? ABLATION    ? CHOLECYSTECTOMY    ? COLONOSCOPY WITH PROPOFOL N/A 05/28/2020  ? Procedure: COLONOSCOPY WITH PROPOFOL;  Surgeon: Dolores Frame, MD;  Location: [facility];  Service: Gastroenterology;  Laterality: N/A;  11:15  ? ? ?OB History   ?No obstetric history on file. ?  ? ? ?Social History  ? ?Socioeconomic History  ? Marital status: Married  ?  Spouse name: Not on file  ? Number of children: Not on file  ? Years of education: Not on file  ? Highest education level: Not on file  ?Occupational History  ? Not on file  ?Tobacco Use  ? Smoking status: Never  ? Smokeless tobacco: Never  ?Vaping Use  ? Vaping Use: Never used  ?Substance and Sexual Activity  ? Alcohol use: No  ? Drug use: No  ? Sexual activity: Yes  ?  Birth control/protection: Surgical  ?  Comment: ablation  ?Other Topics Concern  ? Not on file  ?Social History Narrative  ? Not on file  ? ?Social Determinants of Health  ? ?Financial Resource Strain: Low Risk   ? Difficulty of Paying Living Expenses: Not very hard  ?Food Insecurity: No Food Insecurity  ? Worried About Programme researcher, broadcasting/film/video in the Last Year: Never true  ? Ran Out of Food in the Last Year: Never true  ?Transportation Needs: No Transportation Needs  ? Lack of Transportation (Medical): No  ? Lack of Transportation (Non-Medical):  No  ?Physical Activity: Insufficiently Active  ? Days of Exercise per Week: 2 days  ? Minutes of Exercise per Session: 30 min  ?Stress: Stress Concern Present  ? Feeling of Stress : To some extent  ?Social Connections: Moderately Integrated  ? Frequency of Communication with Friends and Family: More than three times a week  ? Frequency of Social Gatherings with Friends and Family: Once a week  ? Attends Religious Services: More than 4 times per year  ? Active Member of Clubs or Organizations: No  ? Attends Banker Meetings: Never  ? Marital Status: Married  ? ? ?Family History  ?Problem Relation Age of Onset  ? Asthma Mother   ? Diabetes Father   ? Allergic rhinitis Neg Hx   ? Angioedema Neg Hx   ? Eczema Neg Hx   ? Immunodeficiency Neg Hx   ? Urticaria Neg Hx   ? ? ? ?Current Outpatient Medications:  ?  buPROPion (WELLBUTRIN XL) 150 MG 24 hr tablet, Take 150 mg by mouth every morning., Disp: , Rfl:  ?  Cholecalciferol (VITAMIN D3) 250 MCG (10000 UT) capsule, Take 10,000 Units by mouth daily., Disp: , Rfl:  ?  citalopram (CELEXA) 20 MG tablet, Take 20 mg by mouth daily., Disp: , Rfl:  ?  cyclobenzaprine (  FLEXERIL) 10 MG tablet, Take 10 mg by mouth See admin instructions. Take 10 mg daily, may take a second 10 mg dose as needed for muscle spasms, Disp: , Rfl:  ?  diphenhydrAMINE (BENADRYL) 25 MG tablet, Take 1 tablet (25 mg total) by mouth 3 (three) times daily. Take one tablet three times daily for two days (Patient taking differently: Take 25 mg by mouth daily as needed for itching.), Disp: 10 tablet, Rfl: 0 ?  docusate sodium (COLACE) 250 MG capsule, Take 250 mg by mouth at bedtime., Disp: , Rfl:  ?  Dulaglutide (TRULICITY) 3 MG/0.5ML SOPN, Inject 3 mg into the skin once a week. Sat or Sun, Disp: , Rfl:  ?  hydrOXYzine (VISTARIL) 25 MG capsule, Take 25 mg by mouth 2 (two) times daily., Disp: , Rfl:  ?  lisinopril (ZESTRIL) 2.5 MG tablet, Take 2.5 mg by mouth daily., Disp: , Rfl:  ?  ondansetron  (ZOFRAN) 4 MG tablet, Take 4 mg by mouth every 8 (eight) hours as needed for nausea or vomiting., Disp: , Rfl:  ?  OVER THE COUNTER MEDICATION, Take 1 tablet by mouth 2 (two) times daily. [employer] Her[employer] Plus Energy otc supplement, Disp: , Rfl:  ?  Wheat Dextrin (BENEFIBER PO), Take 2 Scoops by mouth 2 (two) times daily. , Disp: , Rfl:  ?  zolpidem (AMBIEN CR) 6.25 MG CR tablet, Take 6.25 mg by mouth at bedtime as needed., Disp: , Rfl:  ? ?Review of Systems ? ?Review of Systems  ?Constitutional: Negative for fever, chills, weight loss, malaise/fatigue and diaphoresis.  ?HENT: Negative for hearing loss, ear pain, nosebleeds, congestion, sore throat, neck pain, tinnitus and ear discharge.   ?Eyes: Negative for blurred vision, double vision, photophobia, pain, discharge and redness.  ?Respiratory: Negative for cough, hemoptysis, sputum production, shortness of breath, wheezing and stridor.   ?Cardiovascular: Negative for chest pain, palpitations, orthopnea, claudication, leg swelling and PND.  ?Gastrointestinal: negative for abdominal pain. Negative for heartburn, nausea, vomiting, diarrhea, constipation, blood in stool and melena.  ?Genitourinary: Negative for dysuria, urgency, frequency, hematuria and flank pain.  ?Musculoskeletal: Negative for myalgias, back pain, joint pain and falls.  ?Skin: Negative for itching and rash.  ?Neurological: Negative for dizziness, tingling, tremors, sensory change, speech change, focal weakness, seizures, loss of consciousness, weakness and headaches.  ?Endo/Heme/Allergies: Negative for environmental allergies and polydipsia. Does not bruise/bleed easily.  ?Psychiatric/Behavioral: Negative for depression, suicidal ideas, hallucinations, memory loss and substance abuse. The patient is not nervous/anxious and does not have insomnia.   ? ?  ?  ?Objective:  ?Blood pressure (!) 144/86, pulse 92, height 5\' 5"  (1.651 m), weight 212 lb (96.2 kg).  ? Physical Exam  ?Vitals  reviewed. ?Constitutional: She is oriented to person, place, and time. She appears well-developed and well-nourished.  ?HENT:  ?Head: Normocephalic and atraumatic.        ?Right Ear: External ear normal.  ?Left Ear: External ear normal.  ?Nose: Nose normal.  ?Mouth/Throat: Oropharynx is clear and moist.  ?Eyes: Conjunctivae and EOM are normal. Pupils are equal, round, and reactive to light. Right eye exhibits no discharge. Left eye exhibits no discharge. No scleral icterus.  ?Neck: Normal range of motion. Neck supple. No tracheal deviation present. No thyromegaly present.  ?Cardiovascular: Normal rate, regular rhythm, normal heart sounds and intact distal pulses.  Exam reveals no gallop and no friction rub.   ?No murmur heard. ?Respiratory: Effort normal and breath sounds normal. No respiratory distress. She has no wheezes. She  has no rales. She exhibits no tenderness.  ?GI: Soft. Bowel sounds are normal. She exhibits no distension and no mass. There is no tenderness. There is no rebound and no guarding.  ?Genitourinary:  ?Breasts no masses skin changes or nipple changes bilaterally ?     Vulva is normal without lesions ?Vagina is pink moist without discharge ?Cervix normal in appearance and pap is done ?Uterus is normal size shape and contour ?Adnexa is negative with normal sized ovaries  ? ?Musculoskeletal: Normal range of motion. She exhibits no edema and no tenderness.  ?Neurological: She is alert and oriented to person, place, and time. She has normal reflexes. She displays normal reflexes. No cranial nerve deficit. She exhibits normal muscle tone. Coordination normal.  ?Skin: Skin is warm and dry. No rash noted. No erythema. No pallor.  ?Psychiatric: She has a normal mood and affect. Her behavior is normal. Judgment and thought content normal.  ? ?   ? ?Medications Ordered at today's visit: ?No orders of the defined types were placed in this encounter. ? ? ?Other orders placed at today's visit: ?No orders of  the defined types were placed in this encounter. ? ? ? ? ?Assessment:  ? ? Normal Gyn exam.  ?  ?Plan:  ? ? Contraception: post menopausal status. ?Follow up in: 5 years.   ? ? ?Return in about 5 years (around 11/01/2026) for yearly.

## 2021-11-04 LAB — CYTOLOGY - PAP
Chlamydia: NEGATIVE
Comment: NEGATIVE
Comment: NEGATIVE
Comment: NORMAL
Diagnosis: NEGATIVE
High risk HPV: NEGATIVE
Neisseria Gonorrhea: NEGATIVE

## 2022-10-12 ENCOUNTER — Emergency Department (HOSPITAL_COMMUNITY)
Admission: EM | Admit: 2022-10-12 | Discharge: 2022-10-12 | Disposition: A | Discharge status: Home / Self Care | Payer: BC Managed Care – PPO | Attending: Emergency Medicine | Admitting: Emergency Medicine

## 2022-10-12 ENCOUNTER — Other Ambulatory Visit: Payer: Self-pay

## 2022-10-12 ENCOUNTER — Encounter (HOSPITAL_COMMUNITY): Payer: Self-pay | Admitting: *Deleted

## 2022-10-12 DIAGNOSIS — Z79899 Other long term (current) drug therapy: Secondary | ICD-10-CM | POA: Diagnosis not present

## 2022-10-12 DIAGNOSIS — Z7984 Long term (current) use of oral hypoglycemic drugs: Secondary | ICD-10-CM | POA: Insufficient documentation

## 2022-10-12 DIAGNOSIS — E1165 Type 2 diabetes mellitus with hyperglycemia: Secondary | ICD-10-CM | POA: Insufficient documentation

## 2022-10-12 DIAGNOSIS — I10 Essential (primary) hypertension: Secondary | ICD-10-CM | POA: Diagnosis not present

## 2022-10-12 DIAGNOSIS — R739 Hyperglycemia, unspecified: Secondary | ICD-10-CM

## 2022-10-12 DIAGNOSIS — R5383 Other fatigue: Secondary | ICD-10-CM | POA: Diagnosis present

## 2022-10-12 LAB — BASIC METABOLIC PANEL
Anion gap: 10 (ref 5–15)
BUN: 15 mg/dL (ref 6–20)
CO2: 24 mmol/L (ref 22–32)
Calcium: 9.4 mg/dL (ref 8.9–10.3)
Chloride: 97 mmol/L — ABNORMAL LOW (ref 98–111)
Creatinine, Ser: 1.17 mg/dL — ABNORMAL HIGH (ref 0.44–1.00)
GFR, Estimated: 54 mL/min — ABNORMAL LOW (ref >60)
Glucose, Bld: 344 mg/dL — ABNORMAL HIGH (ref 70–99)
Potassium: 4.5 mmol/L (ref 3.5–5.1)
Sodium: 131 mmol/L — ABNORMAL LOW (ref 135–145)

## 2022-10-12 LAB — CBC
HCT: 39.9 % (ref 36.0–46.0)
Hemoglobin: 12.5 g/dL (ref 12.0–15.0)
MCH: 25.9 pg — ABNORMAL LOW (ref 26.0–34.0)
MCHC: 31.3 g/dL (ref 30.0–36.0)
MCV: 82.6 fL (ref 80.0–100.0)
Platelets: 258 10*3/uL (ref 150–400)
RBC: 4.83 MIL/uL (ref 3.87–5.11)
RDW: 13.6 % (ref 11.5–15.5)
WBC: 7.5 10*3/uL (ref 4.0–10.5)
nRBC: 0 % (ref 0.0–0.2)

## 2022-10-12 LAB — URINALYSIS, ROUTINE W REFLEX MICROSCOPIC
Bilirubin Urine: NEGATIVE
Glucose, UA: 50 mg/dL — AB
Hgb urine dipstick: NEGATIVE
Ketones, ur: 20 mg/dL — AB
Leukocytes,Ua: NEGATIVE
Nitrite: NEGATIVE
Protein, ur: NEGATIVE mg/dL
Specific Gravity, Urine: 1.034 — ABNORMAL HIGH (ref 1.005–1.030)
pH: 5 (ref 5.0–8.0)

## 2022-10-12 LAB — CBG MONITORING, ED
Glucose-Capillary: 359 mg/dL — ABNORMAL HIGH (ref 70–99)
Glucose-Capillary: 221 mg/dL — ABNORMAL HIGH (ref 70–99)
Glucose-Capillary: 266 mg/dL — ABNORMAL HIGH (ref 70–99)

## 2022-10-12 MED ORDER — SODIUM CHLORIDE 0.9 % IV BOLUS
1000.0000 mL | Freq: Once | INTRAVENOUS | Status: AC
  Administered 2022-10-12: 1000 mL via INTRAVENOUS

## 2022-10-12 NOTE — ED Provider Notes (Signed)
[facility] Provider Note   CSN: [id number] Arrival date & time: 10/12/22  1403     History  Chief Complaint  Patient presents with   Hyperglycemia    Theresa Sullivan is a 57 y.o. female.  With a history of non-insulin-dependent type 2 diabetes, hypertension, anxiety who presents to the ED via her primary care provider's office for evaluation of hyperglycemia.  She states that her insurance changed in December and she was unable to continue the Trulicity that she was on.  She was switched to [facility] by her primary care provider but it was too expensive so she was unable to take this.  She then was unable to follow-up with her primary care provider for a couple of months and was without any diabetes medications.  She returned to her primary care provider last week and was started on glipizide.  She started taking this 2 days ago.  She returns today for follow-up labs and was encouraged to come to the ED due to hyperglycemia.  She states she has been checking her blood sugar at home and has typically been between 200 and 400.  She states she has been fatigued and thirsty over the past 2 weeks.  She also endorses polyuria.  Denies polyphasia.  She also denies abdominal pain, nausea, vomiting, chest pain, shortness of breath, dizziness, lightheadedness.  States she was told to come to the ED due to possibility of a coma.  Denies history of DKA or HHS.  She reports being unable to tolerate metformin in the past   Hyperglycemia Associated symptoms: fatigue        Home Medications Prior to Admission medications   Medication Sig Start Date End Date Taking? Authorizing Provider  buPROPion (WELLBUTRIN XL) 150 MG 24 hr tablet Take 150 mg by mouth daily.   Yes [provider]  Cholecalciferol (VITAMIN D3) 250 MCG (10000 UT) capsule Take 10,000 Units by mouth daily.   Yes [provider]  diazepam (VALIUM) 10 MG tablet Take 10 mg by  mouth at bedtime as needed for anxiety or sleep. 11/03/19  Yes [provider]  Flaxseed Oil OIL Take 1 drop by mouth daily.   Yes [provider]  glipiZIDE (GLUCOTROL) 10 MG tablet Take 10 mg by mouth 2 (two) times daily. 10/09/22  Yes [provider]  lisinopril (ZESTRIL) 2.5 MG tablet Take 2.5 mg by mouth daily.   Yes [provider]      Allergies    Shellfish allergy    Review of Systems   Review of Systems  Constitutional:  Positive for fatigue.  All other systems reviewed and are negative.   Physical Exam Updated Vital Signs BP 135/77   Pulse 70   Temp 98.1 F (36.7 C) (Oral)   Resp 16   Ht 5\' 5"  (1.651 m)   Wt 94.8 kg   SpO2 100%   BMI 34.78 kg/m  Physical Exam Vitals and nursing note reviewed.  Constitutional:      General: She is not in acute distress.    Appearance: She is well-developed. She is obese.  HENT:     Head: Normocephalic and atraumatic.     Mouth/Throat:     Comments: Moist mucous membranes Eyes:     Conjunctiva/sclera: Conjunctivae normal.  Cardiovascular:     Rate and Rhythm: Normal rate and regular rhythm.     Heart sounds: No murmur heard. Pulmonary:     Effort: Pulmonary  effort is normal. No respiratory distress.     Breath sounds: Normal breath sounds. No stridor. No wheezing, rhonchi or rales.  Abdominal:     Palpations: Abdomen is soft.     Tenderness: There is no abdominal tenderness. There is no guarding.  Musculoskeletal:        General: No swelling.     Cervical back: Neck supple.     Right lower leg: No edema.     Left lower leg: No edema.  Skin:    General: Skin is warm and dry.     Capillary Refill: Capillary refill takes less than 2 seconds.  Neurological:     General: No focal deficit present.     Mental Status: She is alert and oriented to person, place, and time.  Psychiatric:        Mood and Affect: Mood normal.     ED Results / Procedures / Treatments   Labs (all labs  ordered are listed, but only abnormal results are displayed) Labs Reviewed  BASIC METABOLIC PANEL - Abnormal; Notable for the following components:      Result Value   Sodium 131 (*)    Chloride 97 (*)    Glucose, Bld 344 (*)    Creatinine, Ser 1.17 (*)    GFR, Estimated 54 (*)    All other components within normal limits  CBC - Abnormal; Notable for the following components:   MCH 25.9 (*)    All other components within normal limits  URINALYSIS, ROUTINE W REFLEX MICROSCOPIC - Abnormal; Notable for the following components:   Specific Gravity, Urine 1.034 (*)    Glucose, UA 50 (*)    Ketones, ur 20 (*)    All other components within normal limits  CBG MONITORING, ED - Abnormal; Notable for the following components:   Glucose-Capillary 359 (*)    All other components within normal limits  CBG MONITORING, ED - Abnormal; Notable for the following components:   Glucose-Capillary 221 (*)    All other components within normal limits  CBG MONITORING, ED - Abnormal; Notable for the following components:   Glucose-Capillary 266 (*)    All other components within normal limits    EKG None  Radiology No results found.  Procedures Procedures    Medications Ordered in ED Medications  sodium chloride 0.9 % bolus 1,000 mL (0 mLs Intravenous Stopped 10/12/22 2012)    ED Course/ Medical Decision Making/ A&P                             Medical Decision Making Amount and/or Complexity of Data Reviewed Labs: ordered.  This patient presents to the ED for concern of hyperglycemia, this involves an extensive number of treatment options, and is a complaint that carries with it a high risk of complications and morbidity.  Differential diagnosis includes hyperglycemia, DKA, HHS  Co morbidities that complicate the patient evaluation  non-insulin-dependent type 2 diabetes, hypertension, anxiety  My initial workup includes labs, IV fluids  Additional history obtained from: Nursing  notes from this visit.  I ordered, reviewed and interpreted labs which include: BMP, CBC, urinalysis.  Hyperglycemia of 344 on BMP with repeat CBG of 221.  Pseudohyponatremia of 131.  Stable creatinine of 1.17.  Urinalysis with 20 ketones and concentrated with a specific gravity of 1.034.  Anion gap of 10  Afebrile, hemodynamically stable.  57 year old female presents ED for evaluation of hyperglycemia.  She has  now been on her diabetes medications until recently due to insurance issues.  She appears very well on exam.  Mucous membranes are moist.  She is mentating at baseline.  Lab workup significant for above.  With a normal anion gap and reassuring exam, very low suspicion for DKA at this time.  Patient was given IV fluids and reported significant improvement in her symptoms of thirst.  She was encouraged to continue taking her new diabetes medications and follow-up with her primary care provider for reevaluation in 1 week.  She was given return precautions.  Stable at discharge.  At this time there does not appear to be any evidence of an acute emergency medical condition and the patient appears stable for discharge with appropriate outpatient follow up. Diagnosis was discussed with patient who verbalizes understanding of care plan and is agreeable to discharge. I have discussed return precautions with patient who verbalizes understanding. Patient encouraged to follow-up with their PCP within 1 week. All questions answered.  Note: Portions of this report may have been transcribed using voice recognition software. Every effort was made to ensure accuracy; however, inadvertent computerized transcription errors may still be present.        Final Clinical Impression(s) / ED Diagnoses Final diagnoses:  Hyperglycemia    Rx / DC Orders ED Discharge Orders     None         Nehemiah Massed 10/12/22 2019    Fransico Meadow, MD 10/13/22 [phone]

## 2022-10-12 NOTE — Discharge Instructions (Addendum)
You have been seen today for your complaint of high blood sugar. Your lab work showed high blood sugar but was otherwise reassuring. Your discharge medications include your home medications. Home care instructions are as follows:  Eat a low carbohydrate diet.  Drink plenty of water.  Continue taking your medications as prescribed Follow up with: Your primary care provider in 1 week for reevaluation of your symptoms Please seek immediate medical care if you develop any of the following symptoms: Your blood glucose monitor reads "high" even when you are taking insulin. You have trouble breathing. You have a change in how you think, feel, or act (mental status). You have nausea or vomiting that does not go away. At this time there does not appear to be the presence of an emergent medical condition, however there is always the potential for conditions to change. Please read and follow the below instructions.  Do not take your medicine if  develop an itchy rash, swelling in your mouth or lips, or difficulty breathing; call 911 and seek immediate emergency medical attention if this occurs.  You may review your lab tests and imaging results in their entirety on your MyChart account.  Please discuss all results of fully with your primary care provider and other specialist at your follow-up visit.  Note: Portions of this text may have been transcribed using voice recognition software. Every effort was made to ensure accuracy; however, inadvertent computerized transcription errors may still be present.

## 2022-10-12 NOTE — ED Triage Notes (Signed)
Pt with elevated blood sugars, 367 today and over 400 yesterday.

## 2022-10-12 NOTE — ED Notes (Signed)
Dc instructions reviewed with pt no questions or concerns at this time. Will follow up with pcp in 1 week.
